# Patient Record
Sex: Female | Born: 1935 | Race: Black or African American | Hispanic: No | Smoking: Never smoker
Health system: Southern US, Community
[De-identification: ages and names within clinical notes are randomized; demographics above are authoritative.]

## PROBLEM LIST (undated history)

## (undated) DIAGNOSIS — K219 Gastro-esophageal reflux disease without esophagitis: Secondary | ICD-10-CM

## (undated) DIAGNOSIS — E079 Disorder of thyroid, unspecified: Secondary | ICD-10-CM

## (undated) DIAGNOSIS — E119 Type 2 diabetes mellitus without complications: Secondary | ICD-10-CM

## (undated) DIAGNOSIS — I10 Essential (primary) hypertension: Secondary | ICD-10-CM

## (undated) HISTORY — PX: ABDOMINAL HYSTERECTOMY: SHX81

---

## 2005-06-16 ENCOUNTER — Ambulatory Visit: Payer: Self-pay | Admitting: Urology

## 2005-07-11 ENCOUNTER — Ambulatory Visit: Payer: Self-pay | Admitting: Internal Medicine

## 2005-08-15 ENCOUNTER — Ambulatory Visit: Payer: Self-pay

## 2005-10-21 ENCOUNTER — Ambulatory Visit: Payer: Self-pay | Admitting: Internal Medicine

## 2006-07-31 ENCOUNTER — Ambulatory Visit: Payer: Self-pay | Admitting: Internal Medicine

## 2006-12-03 ENCOUNTER — Ambulatory Visit: Payer: Self-pay | Admitting: Internal Medicine

## 2007-05-24 ENCOUNTER — Inpatient Hospital Stay: Payer: Self-pay | Admitting: Internal Medicine

## 2007-05-24 ENCOUNTER — Other Ambulatory Visit: Payer: Self-pay

## 2007-12-06 ENCOUNTER — Ambulatory Visit: Payer: Self-pay | Admitting: Internal Medicine

## 2008-03-17 ENCOUNTER — Ambulatory Visit: Payer: Self-pay | Admitting: Gastroenterology

## 2008-06-14 ENCOUNTER — Ambulatory Visit: Payer: Self-pay | Admitting: Internal Medicine

## 2008-12-11 ENCOUNTER — Ambulatory Visit: Payer: Self-pay | Admitting: Internal Medicine

## 2009-12-17 ENCOUNTER — Ambulatory Visit: Payer: Self-pay

## 2010-10-01 ENCOUNTER — Encounter (INDEPENDENT_AMBULATORY_CARE_PROVIDER_SITE_OTHER): Payer: Medicare Other | Admitting: Ophthalmology

## 2010-10-01 DIAGNOSIS — H353290 Exudative age-related macular degeneration, unspecified eye, stage unspecified: Secondary | ICD-10-CM

## 2010-10-01 DIAGNOSIS — H43819 Vitreous degeneration, unspecified eye: Secondary | ICD-10-CM

## 2010-10-01 DIAGNOSIS — H3530 Unspecified macular degeneration: Secondary | ICD-10-CM

## 2010-10-28 ENCOUNTER — Ambulatory Visit: Payer: Self-pay | Admitting: Internal Medicine

## 2010-12-24 ENCOUNTER — Encounter (INDEPENDENT_AMBULATORY_CARE_PROVIDER_SITE_OTHER): Payer: Medicare Other | Admitting: Ophthalmology

## 2010-12-24 DIAGNOSIS — H43819 Vitreous degeneration, unspecified eye: Secondary | ICD-10-CM

## 2010-12-24 DIAGNOSIS — H2510 Age-related nuclear cataract, unspecified eye: Secondary | ICD-10-CM

## 2010-12-24 DIAGNOSIS — H3530 Unspecified macular degeneration: Secondary | ICD-10-CM

## 2010-12-24 DIAGNOSIS — H353290 Exudative age-related macular degeneration, unspecified eye, stage unspecified: Secondary | ICD-10-CM

## 2011-02-06 ENCOUNTER — Ambulatory Visit: Payer: Self-pay | Admitting: Internal Medicine

## 2011-02-18 ENCOUNTER — Ambulatory Visit: Payer: Self-pay | Admitting: Internal Medicine

## 2011-02-25 ENCOUNTER — Encounter (INDEPENDENT_AMBULATORY_CARE_PROVIDER_SITE_OTHER): Payer: Medicare Other | Admitting: Ophthalmology

## 2011-02-25 DIAGNOSIS — H43819 Vitreous degeneration, unspecified eye: Secondary | ICD-10-CM

## 2011-02-25 DIAGNOSIS — H2510 Age-related nuclear cataract, unspecified eye: Secondary | ICD-10-CM

## 2011-02-25 DIAGNOSIS — H353290 Exudative age-related macular degeneration, unspecified eye, stage unspecified: Secondary | ICD-10-CM

## 2011-02-25 DIAGNOSIS — H3530 Unspecified macular degeneration: Secondary | ICD-10-CM

## 2011-05-06 ENCOUNTER — Encounter (INDEPENDENT_AMBULATORY_CARE_PROVIDER_SITE_OTHER): Payer: Medicare Other | Admitting: Ophthalmology

## 2011-05-06 DIAGNOSIS — H43819 Vitreous degeneration, unspecified eye: Secondary | ICD-10-CM

## 2011-05-06 DIAGNOSIS — H3530 Unspecified macular degeneration: Secondary | ICD-10-CM

## 2011-05-06 DIAGNOSIS — H353290 Exudative age-related macular degeneration, unspecified eye, stage unspecified: Secondary | ICD-10-CM

## 2011-05-06 DIAGNOSIS — H2510 Age-related nuclear cataract, unspecified eye: Secondary | ICD-10-CM

## 2011-07-08 ENCOUNTER — Encounter (INDEPENDENT_AMBULATORY_CARE_PROVIDER_SITE_OTHER): Payer: Medicare Other | Admitting: Ophthalmology

## 2011-07-21 ENCOUNTER — Encounter (INDEPENDENT_AMBULATORY_CARE_PROVIDER_SITE_OTHER): Payer: Medicare Other | Admitting: Ophthalmology

## 2011-07-21 DIAGNOSIS — H353290 Exudative age-related macular degeneration, unspecified eye, stage unspecified: Secondary | ICD-10-CM

## 2011-07-21 DIAGNOSIS — H3530 Unspecified macular degeneration: Secondary | ICD-10-CM

## 2011-07-21 DIAGNOSIS — H43399 Other vitreous opacities, unspecified eye: Secondary | ICD-10-CM

## 2011-08-20 ENCOUNTER — Ambulatory Visit: Payer: Self-pay | Admitting: Internal Medicine

## 2011-09-29 ENCOUNTER — Encounter (INDEPENDENT_AMBULATORY_CARE_PROVIDER_SITE_OTHER): Payer: Medicare Other | Admitting: Ophthalmology

## 2011-09-29 DIAGNOSIS — H3530 Unspecified macular degeneration: Secondary | ICD-10-CM

## 2011-09-29 DIAGNOSIS — H43819 Vitreous degeneration, unspecified eye: Secondary | ICD-10-CM

## 2011-09-29 DIAGNOSIS — H353290 Exudative age-related macular degeneration, unspecified eye, stage unspecified: Secondary | ICD-10-CM

## 2011-09-29 DIAGNOSIS — I10 Essential (primary) hypertension: Secondary | ICD-10-CM

## 2011-09-29 DIAGNOSIS — H35039 Hypertensive retinopathy, unspecified eye: Secondary | ICD-10-CM

## 2011-10-23 ENCOUNTER — Ambulatory Visit: Payer: Self-pay

## 2011-10-23 LAB — COMPREHENSIVE METABOLIC PANEL
Albumin: 3.4 g/dL (ref 3.4–5.0)
BUN: 8 mg/dL (ref 7–18)
Bilirubin,Total: 0.2 mg/dL (ref 0.2–1.0)
Chloride: 102 mmol/L (ref 98–107)
Creatinine: 0.76 mg/dL (ref 0.60–1.30)
EGFR (African American): >60
Glucose: 78 mg/dL (ref 65–99)
SGPT (ALT): 34 U/L (ref 12–78)
Total Protein: 7.3 g/dL (ref 6.4–8.2)

## 2011-10-23 LAB — CBC WITH DIFFERENTIAL/PLATELET
Basophil %: 1.1 %
Eosinophil %: 2.5 %
HCT: 35.2 % (ref 35.0–47.0)
HGB: 11.7 g/dL — ABNORMAL LOW (ref 12.0–16.0)
Lymphocyte %: 26.5 %
MCHC: 33.3 g/dL (ref 32.0–36.0)
MCV: 94 fL (ref 80–100)
Monocyte #: 0.6 x10 3/mm (ref 0.2–0.9)
Monocyte %: 7.3 %
Neutrophil %: 62.6 %
WBC: 8.5 10*3/uL (ref 3.6–11.0)

## 2011-11-03 ENCOUNTER — Encounter (INDEPENDENT_AMBULATORY_CARE_PROVIDER_SITE_OTHER): Payer: Medicare Other | Admitting: Ophthalmology

## 2011-11-03 DIAGNOSIS — I10 Essential (primary) hypertension: Secondary | ICD-10-CM

## 2011-11-03 DIAGNOSIS — H353290 Exudative age-related macular degeneration, unspecified eye, stage unspecified: Secondary | ICD-10-CM

## 2011-11-03 DIAGNOSIS — H35039 Hypertensive retinopathy, unspecified eye: Secondary | ICD-10-CM

## 2011-11-03 DIAGNOSIS — H43819 Vitreous degeneration, unspecified eye: Secondary | ICD-10-CM

## 2011-12-01 ENCOUNTER — Encounter (INDEPENDENT_AMBULATORY_CARE_PROVIDER_SITE_OTHER): Payer: Medicare Other | Admitting: Ophthalmology

## 2011-12-01 DIAGNOSIS — H353290 Exudative age-related macular degeneration, unspecified eye, stage unspecified: Secondary | ICD-10-CM

## 2011-12-01 DIAGNOSIS — H43819 Vitreous degeneration, unspecified eye: Secondary | ICD-10-CM

## 2011-12-01 DIAGNOSIS — I10 Essential (primary) hypertension: Secondary | ICD-10-CM

## 2011-12-01 DIAGNOSIS — H35039 Hypertensive retinopathy, unspecified eye: Secondary | ICD-10-CM

## 2012-02-02 ENCOUNTER — Encounter (INDEPENDENT_AMBULATORY_CARE_PROVIDER_SITE_OTHER): Payer: Medicare PPO | Admitting: Ophthalmology

## 2012-02-02 DIAGNOSIS — H353290 Exudative age-related macular degeneration, unspecified eye, stage unspecified: Secondary | ICD-10-CM

## 2012-02-02 DIAGNOSIS — I10 Essential (primary) hypertension: Secondary | ICD-10-CM

## 2012-02-02 DIAGNOSIS — H35039 Hypertensive retinopathy, unspecified eye: Secondary | ICD-10-CM

## 2012-02-02 DIAGNOSIS — H43819 Vitreous degeneration, unspecified eye: Secondary | ICD-10-CM

## 2012-02-24 ENCOUNTER — Encounter (INDEPENDENT_AMBULATORY_CARE_PROVIDER_SITE_OTHER): Payer: Medicare PPO | Admitting: Ophthalmology

## 2012-02-24 DIAGNOSIS — H35039 Hypertensive retinopathy, unspecified eye: Secondary | ICD-10-CM

## 2012-02-24 DIAGNOSIS — I10 Essential (primary) hypertension: Secondary | ICD-10-CM

## 2012-02-24 DIAGNOSIS — H43819 Vitreous degeneration, unspecified eye: Secondary | ICD-10-CM

## 2012-02-24 DIAGNOSIS — H353290 Exudative age-related macular degeneration, unspecified eye, stage unspecified: Secondary | ICD-10-CM

## 2012-04-19 ENCOUNTER — Encounter (INDEPENDENT_AMBULATORY_CARE_PROVIDER_SITE_OTHER): Payer: Medicare HMO | Admitting: Ophthalmology

## 2012-04-19 DIAGNOSIS — H43819 Vitreous degeneration, unspecified eye: Secondary | ICD-10-CM

## 2012-04-19 DIAGNOSIS — H353290 Exudative age-related macular degeneration, unspecified eye, stage unspecified: Secondary | ICD-10-CM

## 2012-04-19 DIAGNOSIS — I10 Essential (primary) hypertension: Secondary | ICD-10-CM

## 2012-04-19 DIAGNOSIS — H35039 Hypertensive retinopathy, unspecified eye: Secondary | ICD-10-CM

## 2012-04-29 ENCOUNTER — Ambulatory Visit: Payer: Self-pay | Admitting: Family Medicine

## 2012-05-11 ENCOUNTER — Encounter (INDEPENDENT_AMBULATORY_CARE_PROVIDER_SITE_OTHER): Payer: Medicare HMO | Admitting: Ophthalmology

## 2012-05-11 DIAGNOSIS — H353290 Exudative age-related macular degeneration, unspecified eye, stage unspecified: Secondary | ICD-10-CM

## 2012-05-11 DIAGNOSIS — I10 Essential (primary) hypertension: Secondary | ICD-10-CM

## 2012-05-11 DIAGNOSIS — H35039 Hypertensive retinopathy, unspecified eye: Secondary | ICD-10-CM

## 2012-05-11 DIAGNOSIS — H43819 Vitreous degeneration, unspecified eye: Secondary | ICD-10-CM

## 2012-07-19 ENCOUNTER — Encounter (INDEPENDENT_AMBULATORY_CARE_PROVIDER_SITE_OTHER): Payer: Medicare HMO | Admitting: Ophthalmology

## 2012-07-19 DIAGNOSIS — E785 Hyperlipidemia, unspecified: Secondary | ICD-10-CM | POA: Insufficient documentation

## 2012-07-19 DIAGNOSIS — I10 Essential (primary) hypertension: Secondary | ICD-10-CM | POA: Insufficient documentation

## 2012-07-19 DIAGNOSIS — H35039 Hypertensive retinopathy, unspecified eye: Secondary | ICD-10-CM

## 2012-07-19 DIAGNOSIS — H353290 Exudative age-related macular degeneration, unspecified eye, stage unspecified: Secondary | ICD-10-CM

## 2012-07-19 DIAGNOSIS — M1990 Unspecified osteoarthritis, unspecified site: Secondary | ICD-10-CM | POA: Insufficient documentation

## 2012-07-19 DIAGNOSIS — J302 Other seasonal allergic rhinitis: Secondary | ICD-10-CM | POA: Insufficient documentation

## 2012-07-19 DIAGNOSIS — H43819 Vitreous degeneration, unspecified eye: Secondary | ICD-10-CM

## 2012-08-10 ENCOUNTER — Encounter (INDEPENDENT_AMBULATORY_CARE_PROVIDER_SITE_OTHER): Payer: Medicare HMO | Admitting: Ophthalmology

## 2012-08-30 ENCOUNTER — Encounter (INDEPENDENT_AMBULATORY_CARE_PROVIDER_SITE_OTHER): Payer: Medicare HMO | Admitting: Ophthalmology

## 2012-08-30 DIAGNOSIS — I10 Essential (primary) hypertension: Secondary | ICD-10-CM

## 2012-08-30 DIAGNOSIS — H353290 Exudative age-related macular degeneration, unspecified eye, stage unspecified: Secondary | ICD-10-CM

## 2012-08-30 DIAGNOSIS — M8580 Other specified disorders of bone density and structure, unspecified site: Secondary | ICD-10-CM | POA: Insufficient documentation

## 2012-08-30 DIAGNOSIS — H35039 Hypertensive retinopathy, unspecified eye: Secondary | ICD-10-CM

## 2012-08-30 DIAGNOSIS — H43819 Vitreous degeneration, unspecified eye: Secondary | ICD-10-CM

## 2012-08-30 DIAGNOSIS — E1139 Type 2 diabetes mellitus with other diabetic ophthalmic complication: Secondary | ICD-10-CM

## 2012-08-30 DIAGNOSIS — E11319 Type 2 diabetes mellitus with unspecified diabetic retinopathy without macular edema: Secondary | ICD-10-CM

## 2012-10-11 ENCOUNTER — Encounter (INDEPENDENT_AMBULATORY_CARE_PROVIDER_SITE_OTHER): Payer: Medicare HMO | Admitting: Ophthalmology

## 2012-10-11 DIAGNOSIS — H35039 Hypertensive retinopathy, unspecified eye: Secondary | ICD-10-CM

## 2012-10-11 DIAGNOSIS — I10 Essential (primary) hypertension: Secondary | ICD-10-CM

## 2012-10-11 DIAGNOSIS — H353290 Exudative age-related macular degeneration, unspecified eye, stage unspecified: Secondary | ICD-10-CM

## 2012-10-11 DIAGNOSIS — H43819 Vitreous degeneration, unspecified eye: Secondary | ICD-10-CM

## 2012-11-01 ENCOUNTER — Encounter (INDEPENDENT_AMBULATORY_CARE_PROVIDER_SITE_OTHER): Payer: Medicare HMO | Admitting: Ophthalmology

## 2012-11-02 ENCOUNTER — Encounter (INDEPENDENT_AMBULATORY_CARE_PROVIDER_SITE_OTHER): Payer: Medicare HMO | Admitting: Ophthalmology

## 2012-11-02 DIAGNOSIS — H35039 Hypertensive retinopathy, unspecified eye: Secondary | ICD-10-CM

## 2012-11-02 DIAGNOSIS — I10 Essential (primary) hypertension: Secondary | ICD-10-CM

## 2012-11-02 DIAGNOSIS — H43819 Vitreous degeneration, unspecified eye: Secondary | ICD-10-CM

## 2012-11-02 DIAGNOSIS — H3530 Unspecified macular degeneration: Secondary | ICD-10-CM

## 2012-11-02 DIAGNOSIS — E1139 Type 2 diabetes mellitus with other diabetic ophthalmic complication: Secondary | ICD-10-CM

## 2012-11-02 DIAGNOSIS — E11319 Type 2 diabetes mellitus with unspecified diabetic retinopathy without macular edema: Secondary | ICD-10-CM

## 2012-11-02 DIAGNOSIS — H353290 Exudative age-related macular degeneration, unspecified eye, stage unspecified: Secondary | ICD-10-CM

## 2012-11-11 DIAGNOSIS — M26629 Arthralgia of temporomandibular joint, unspecified side: Secondary | ICD-10-CM | POA: Insufficient documentation

## 2012-11-30 ENCOUNTER — Encounter (INDEPENDENT_AMBULATORY_CARE_PROVIDER_SITE_OTHER): Payer: Medicare HMO | Admitting: Ophthalmology

## 2012-11-30 DIAGNOSIS — I10 Essential (primary) hypertension: Secondary | ICD-10-CM

## 2012-11-30 DIAGNOSIS — H43819 Vitreous degeneration, unspecified eye: Secondary | ICD-10-CM

## 2012-11-30 DIAGNOSIS — H35039 Hypertensive retinopathy, unspecified eye: Secondary | ICD-10-CM

## 2012-11-30 DIAGNOSIS — E1139 Type 2 diabetes mellitus with other diabetic ophthalmic complication: Secondary | ICD-10-CM

## 2012-11-30 DIAGNOSIS — E11319 Type 2 diabetes mellitus with unspecified diabetic retinopathy without macular edema: Secondary | ICD-10-CM

## 2012-11-30 DIAGNOSIS — H3530 Unspecified macular degeneration: Secondary | ICD-10-CM

## 2012-11-30 DIAGNOSIS — H353290 Exudative age-related macular degeneration, unspecified eye, stage unspecified: Secondary | ICD-10-CM

## 2013-01-17 ENCOUNTER — Encounter (INDEPENDENT_AMBULATORY_CARE_PROVIDER_SITE_OTHER): Payer: Medicare HMO | Admitting: Ophthalmology

## 2013-01-17 DIAGNOSIS — H353290 Exudative age-related macular degeneration, unspecified eye, stage unspecified: Secondary | ICD-10-CM

## 2013-01-17 DIAGNOSIS — E1139 Type 2 diabetes mellitus with other diabetic ophthalmic complication: Secondary | ICD-10-CM

## 2013-01-17 DIAGNOSIS — E11319 Type 2 diabetes mellitus with unspecified diabetic retinopathy without macular edema: Secondary | ICD-10-CM

## 2013-01-17 DIAGNOSIS — I10 Essential (primary) hypertension: Secondary | ICD-10-CM

## 2013-01-17 DIAGNOSIS — H35039 Hypertensive retinopathy, unspecified eye: Secondary | ICD-10-CM

## 2013-01-17 DIAGNOSIS — H43819 Vitreous degeneration, unspecified eye: Secondary | ICD-10-CM

## 2013-03-01 ENCOUNTER — Encounter (INDEPENDENT_AMBULATORY_CARE_PROVIDER_SITE_OTHER): Payer: Medicare HMO | Admitting: Ophthalmology

## 2013-03-01 DIAGNOSIS — E1165 Type 2 diabetes mellitus with hyperglycemia: Secondary | ICD-10-CM

## 2013-03-01 DIAGNOSIS — I10 Essential (primary) hypertension: Secondary | ICD-10-CM

## 2013-03-01 DIAGNOSIS — E1139 Type 2 diabetes mellitus with other diabetic ophthalmic complication: Secondary | ICD-10-CM

## 2013-03-01 DIAGNOSIS — H35039 Hypertensive retinopathy, unspecified eye: Secondary | ICD-10-CM

## 2013-03-01 DIAGNOSIS — H3530 Unspecified macular degeneration: Secondary | ICD-10-CM

## 2013-03-01 DIAGNOSIS — E11319 Type 2 diabetes mellitus with unspecified diabetic retinopathy without macular edema: Secondary | ICD-10-CM

## 2013-03-01 DIAGNOSIS — H353290 Exudative age-related macular degeneration, unspecified eye, stage unspecified: Secondary | ICD-10-CM

## 2013-04-12 ENCOUNTER — Encounter (INDEPENDENT_AMBULATORY_CARE_PROVIDER_SITE_OTHER): Payer: Medicare HMO | Admitting: Ophthalmology

## 2013-04-12 DIAGNOSIS — I10 Essential (primary) hypertension: Secondary | ICD-10-CM

## 2013-04-12 DIAGNOSIS — H3530 Unspecified macular degeneration: Secondary | ICD-10-CM

## 2013-04-12 DIAGNOSIS — H353290 Exudative age-related macular degeneration, unspecified eye, stage unspecified: Secondary | ICD-10-CM

## 2013-04-12 DIAGNOSIS — H43819 Vitreous degeneration, unspecified eye: Secondary | ICD-10-CM

## 2013-04-12 DIAGNOSIS — H35039 Hypertensive retinopathy, unspecified eye: Secondary | ICD-10-CM

## 2013-05-24 ENCOUNTER — Encounter (INDEPENDENT_AMBULATORY_CARE_PROVIDER_SITE_OTHER): Payer: Medicare HMO | Admitting: Ophthalmology

## 2013-05-24 DIAGNOSIS — I10 Essential (primary) hypertension: Secondary | ICD-10-CM

## 2013-05-24 DIAGNOSIS — H35039 Hypertensive retinopathy, unspecified eye: Secondary | ICD-10-CM

## 2013-05-24 DIAGNOSIS — H43819 Vitreous degeneration, unspecified eye: Secondary | ICD-10-CM

## 2013-05-24 DIAGNOSIS — H3530 Unspecified macular degeneration: Secondary | ICD-10-CM

## 2013-05-24 DIAGNOSIS — E11319 Type 2 diabetes mellitus with unspecified diabetic retinopathy without macular edema: Secondary | ICD-10-CM

## 2013-05-24 DIAGNOSIS — E1165 Type 2 diabetes mellitus with hyperglycemia: Secondary | ICD-10-CM

## 2013-05-24 DIAGNOSIS — E1139 Type 2 diabetes mellitus with other diabetic ophthalmic complication: Secondary | ICD-10-CM

## 2013-05-24 DIAGNOSIS — H353290 Exudative age-related macular degeneration, unspecified eye, stage unspecified: Secondary | ICD-10-CM

## 2013-07-05 ENCOUNTER — Encounter (INDEPENDENT_AMBULATORY_CARE_PROVIDER_SITE_OTHER): Payer: Medicare HMO | Admitting: Ophthalmology

## 2013-07-05 DIAGNOSIS — E1139 Type 2 diabetes mellitus with other diabetic ophthalmic complication: Secondary | ICD-10-CM

## 2013-07-05 DIAGNOSIS — H353290 Exudative age-related macular degeneration, unspecified eye, stage unspecified: Secondary | ICD-10-CM

## 2013-07-05 DIAGNOSIS — I10 Essential (primary) hypertension: Secondary | ICD-10-CM

## 2013-07-05 DIAGNOSIS — H43819 Vitreous degeneration, unspecified eye: Secondary | ICD-10-CM

## 2013-07-05 DIAGNOSIS — E11319 Type 2 diabetes mellitus with unspecified diabetic retinopathy without macular edema: Secondary | ICD-10-CM

## 2013-07-05 DIAGNOSIS — H35039 Hypertensive retinopathy, unspecified eye: Secondary | ICD-10-CM

## 2013-07-05 DIAGNOSIS — E1165 Type 2 diabetes mellitus with hyperglycemia: Secondary | ICD-10-CM

## 2013-08-01 ENCOUNTER — Emergency Department: Payer: Self-pay | Admitting: Emergency Medicine

## 2013-08-09 ENCOUNTER — Encounter (INDEPENDENT_AMBULATORY_CARE_PROVIDER_SITE_OTHER): Payer: Medicare HMO | Admitting: Ophthalmology

## 2013-08-09 DIAGNOSIS — H35039 Hypertensive retinopathy, unspecified eye: Secondary | ICD-10-CM

## 2013-08-09 DIAGNOSIS — H353290 Exudative age-related macular degeneration, unspecified eye, stage unspecified: Secondary | ICD-10-CM

## 2013-08-09 DIAGNOSIS — I10 Essential (primary) hypertension: Secondary | ICD-10-CM

## 2013-08-09 DIAGNOSIS — E11319 Type 2 diabetes mellitus with unspecified diabetic retinopathy without macular edema: Secondary | ICD-10-CM

## 2013-08-09 DIAGNOSIS — E1165 Type 2 diabetes mellitus with hyperglycemia: Secondary | ICD-10-CM

## 2013-08-09 DIAGNOSIS — E1139 Type 2 diabetes mellitus with other diabetic ophthalmic complication: Secondary | ICD-10-CM

## 2013-08-09 DIAGNOSIS — H3530 Unspecified macular degeneration: Secondary | ICD-10-CM

## 2013-08-09 DIAGNOSIS — H43819 Vitreous degeneration, unspecified eye: Secondary | ICD-10-CM

## 2013-09-13 ENCOUNTER — Encounter (INDEPENDENT_AMBULATORY_CARE_PROVIDER_SITE_OTHER): Payer: Medicare HMO | Admitting: Ophthalmology

## 2013-09-13 DIAGNOSIS — H353290 Exudative age-related macular degeneration, unspecified eye, stage unspecified: Secondary | ICD-10-CM

## 2013-09-13 DIAGNOSIS — H43819 Vitreous degeneration, unspecified eye: Secondary | ICD-10-CM

## 2013-09-13 DIAGNOSIS — E1139 Type 2 diabetes mellitus with other diabetic ophthalmic complication: Secondary | ICD-10-CM

## 2013-09-13 DIAGNOSIS — E11319 Type 2 diabetes mellitus with unspecified diabetic retinopathy without macular edema: Secondary | ICD-10-CM

## 2013-09-13 DIAGNOSIS — E1165 Type 2 diabetes mellitus with hyperglycemia: Secondary | ICD-10-CM

## 2013-09-13 DIAGNOSIS — H35039 Hypertensive retinopathy, unspecified eye: Secondary | ICD-10-CM

## 2013-09-13 DIAGNOSIS — I10 Essential (primary) hypertension: Secondary | ICD-10-CM

## 2013-10-18 ENCOUNTER — Encounter (INDEPENDENT_AMBULATORY_CARE_PROVIDER_SITE_OTHER): Payer: Medicare HMO | Admitting: Ophthalmology

## 2013-10-18 DIAGNOSIS — I10 Essential (primary) hypertension: Secondary | ICD-10-CM

## 2013-10-18 DIAGNOSIS — E1139 Type 2 diabetes mellitus with other diabetic ophthalmic complication: Secondary | ICD-10-CM

## 2013-10-18 DIAGNOSIS — E11319 Type 2 diabetes mellitus with unspecified diabetic retinopathy without macular edema: Secondary | ICD-10-CM

## 2013-10-18 DIAGNOSIS — H353290 Exudative age-related macular degeneration, unspecified eye, stage unspecified: Secondary | ICD-10-CM

## 2013-10-18 DIAGNOSIS — H3530 Unspecified macular degeneration: Secondary | ICD-10-CM

## 2013-10-18 DIAGNOSIS — H43819 Vitreous degeneration, unspecified eye: Secondary | ICD-10-CM

## 2013-10-18 DIAGNOSIS — H35039 Hypertensive retinopathy, unspecified eye: Secondary | ICD-10-CM

## 2013-10-18 DIAGNOSIS — E1165 Type 2 diabetes mellitus with hyperglycemia: Secondary | ICD-10-CM

## 2013-11-22 ENCOUNTER — Encounter (INDEPENDENT_AMBULATORY_CARE_PROVIDER_SITE_OTHER): Payer: Medicare HMO | Admitting: Ophthalmology

## 2013-11-22 DIAGNOSIS — H43813 Vitreous degeneration, bilateral: Secondary | ICD-10-CM

## 2013-11-22 DIAGNOSIS — H35033 Hypertensive retinopathy, bilateral: Secondary | ICD-10-CM

## 2013-11-22 DIAGNOSIS — I10 Essential (primary) hypertension: Secondary | ICD-10-CM

## 2013-11-22 DIAGNOSIS — H3532 Exudative age-related macular degeneration: Secondary | ICD-10-CM

## 2013-12-27 ENCOUNTER — Encounter (INDEPENDENT_AMBULATORY_CARE_PROVIDER_SITE_OTHER): Payer: Medicare HMO | Admitting: Ophthalmology

## 2013-12-27 DIAGNOSIS — E11311 Type 2 diabetes mellitus with unspecified diabetic retinopathy with macular edema: Secondary | ICD-10-CM

## 2013-12-27 DIAGNOSIS — H43813 Vitreous degeneration, bilateral: Secondary | ICD-10-CM

## 2013-12-27 DIAGNOSIS — E11339 Type 2 diabetes mellitus with moderate nonproliferative diabetic retinopathy without macular edema: Secondary | ICD-10-CM

## 2013-12-27 DIAGNOSIS — E11321 Type 2 diabetes mellitus with mild nonproliferative diabetic retinopathy with macular edema: Secondary | ICD-10-CM

## 2013-12-27 DIAGNOSIS — H3532 Exudative age-related macular degeneration: Secondary | ICD-10-CM

## 2013-12-27 DIAGNOSIS — H35033 Hypertensive retinopathy, bilateral: Secondary | ICD-10-CM

## 2013-12-27 DIAGNOSIS — I10 Essential (primary) hypertension: Secondary | ICD-10-CM

## 2014-01-31 ENCOUNTER — Encounter (INDEPENDENT_AMBULATORY_CARE_PROVIDER_SITE_OTHER): Payer: Medicare HMO | Admitting: Ophthalmology

## 2014-02-20 ENCOUNTER — Encounter (INDEPENDENT_AMBULATORY_CARE_PROVIDER_SITE_OTHER): Payer: Medicare HMO | Admitting: Ophthalmology

## 2014-02-20 DIAGNOSIS — E11319 Type 2 diabetes mellitus with unspecified diabetic retinopathy without macular edema: Secondary | ICD-10-CM

## 2014-02-20 DIAGNOSIS — H3532 Exudative age-related macular degeneration: Secondary | ICD-10-CM

## 2014-02-20 DIAGNOSIS — E11329 Type 2 diabetes mellitus with mild nonproliferative diabetic retinopathy without macular edema: Secondary | ICD-10-CM

## 2014-02-20 DIAGNOSIS — H35033 Hypertensive retinopathy, bilateral: Secondary | ICD-10-CM

## 2014-02-20 DIAGNOSIS — H3531 Nonexudative age-related macular degeneration: Secondary | ICD-10-CM

## 2014-02-20 DIAGNOSIS — H43813 Vitreous degeneration, bilateral: Secondary | ICD-10-CM

## 2014-03-17 ENCOUNTER — Encounter (INDEPENDENT_AMBULATORY_CARE_PROVIDER_SITE_OTHER): Payer: Medicare HMO | Admitting: Ophthalmology

## 2014-03-24 ENCOUNTER — Encounter (INDEPENDENT_AMBULATORY_CARE_PROVIDER_SITE_OTHER): Payer: Medicare HMO | Admitting: Ophthalmology

## 2014-03-24 DIAGNOSIS — E11319 Type 2 diabetes mellitus with unspecified diabetic retinopathy without macular edema: Secondary | ICD-10-CM

## 2014-03-24 DIAGNOSIS — H35033 Hypertensive retinopathy, bilateral: Secondary | ICD-10-CM

## 2014-03-24 DIAGNOSIS — I10 Essential (primary) hypertension: Secondary | ICD-10-CM

## 2014-03-24 DIAGNOSIS — H3531 Nonexudative age-related macular degeneration: Secondary | ICD-10-CM | POA: Diagnosis not present

## 2014-03-24 DIAGNOSIS — H43813 Vitreous degeneration, bilateral: Secondary | ICD-10-CM | POA: Diagnosis not present

## 2014-03-24 DIAGNOSIS — H3532 Exudative age-related macular degeneration: Secondary | ICD-10-CM

## 2014-04-28 ENCOUNTER — Encounter (INDEPENDENT_AMBULATORY_CARE_PROVIDER_SITE_OTHER): Payer: Medicare HMO | Admitting: Ophthalmology

## 2014-04-28 DIAGNOSIS — H3532 Exudative age-related macular degeneration: Secondary | ICD-10-CM | POA: Diagnosis not present

## 2014-04-28 DIAGNOSIS — H43813 Vitreous degeneration, bilateral: Secondary | ICD-10-CM

## 2014-04-28 DIAGNOSIS — E11319 Type 2 diabetes mellitus with unspecified diabetic retinopathy without macular edema: Secondary | ICD-10-CM | POA: Diagnosis not present

## 2014-04-28 DIAGNOSIS — E11329 Type 2 diabetes mellitus with mild nonproliferative diabetic retinopathy without macular edema: Secondary | ICD-10-CM

## 2014-04-28 DIAGNOSIS — I10 Essential (primary) hypertension: Secondary | ICD-10-CM | POA: Diagnosis not present

## 2014-04-28 DIAGNOSIS — H35033 Hypertensive retinopathy, bilateral: Secondary | ICD-10-CM

## 2014-04-28 DIAGNOSIS — H3531 Nonexudative age-related macular degeneration: Secondary | ICD-10-CM

## 2014-06-02 ENCOUNTER — Encounter (INDEPENDENT_AMBULATORY_CARE_PROVIDER_SITE_OTHER): Payer: Medicare HMO | Admitting: Ophthalmology

## 2014-06-02 DIAGNOSIS — E11319 Type 2 diabetes mellitus with unspecified diabetic retinopathy without macular edema: Secondary | ICD-10-CM

## 2014-06-02 DIAGNOSIS — H3531 Nonexudative age-related macular degeneration: Secondary | ICD-10-CM | POA: Diagnosis not present

## 2014-06-02 DIAGNOSIS — I10 Essential (primary) hypertension: Secondary | ICD-10-CM | POA: Diagnosis not present

## 2014-06-02 DIAGNOSIS — H43813 Vitreous degeneration, bilateral: Secondary | ICD-10-CM

## 2014-06-02 DIAGNOSIS — H3532 Exudative age-related macular degeneration: Secondary | ICD-10-CM

## 2014-06-02 DIAGNOSIS — E11329 Type 2 diabetes mellitus with mild nonproliferative diabetic retinopathy without macular edema: Secondary | ICD-10-CM

## 2014-06-02 DIAGNOSIS — H35033 Hypertensive retinopathy, bilateral: Secondary | ICD-10-CM

## 2014-07-07 ENCOUNTER — Encounter (INDEPENDENT_AMBULATORY_CARE_PROVIDER_SITE_OTHER): Payer: Medicare PPO | Admitting: Ophthalmology

## 2014-07-07 DIAGNOSIS — H35033 Hypertensive retinopathy, bilateral: Secondary | ICD-10-CM

## 2014-07-07 DIAGNOSIS — I10 Essential (primary) hypertension: Secondary | ICD-10-CM | POA: Diagnosis not present

## 2014-07-07 DIAGNOSIS — E11319 Type 2 diabetes mellitus with unspecified diabetic retinopathy without macular edema: Secondary | ICD-10-CM | POA: Diagnosis not present

## 2014-07-07 DIAGNOSIS — H3532 Exudative age-related macular degeneration: Secondary | ICD-10-CM

## 2014-07-07 DIAGNOSIS — E11339 Type 2 diabetes mellitus with moderate nonproliferative diabetic retinopathy without macular edema: Secondary | ICD-10-CM

## 2014-07-07 DIAGNOSIS — H43813 Vitreous degeneration, bilateral: Secondary | ICD-10-CM | POA: Diagnosis not present

## 2014-07-07 DIAGNOSIS — E11329 Type 2 diabetes mellitus with mild nonproliferative diabetic retinopathy without macular edema: Secondary | ICD-10-CM

## 2014-07-28 DIAGNOSIS — R809 Proteinuria, unspecified: Secondary | ICD-10-CM | POA: Insufficient documentation

## 2014-08-03 DIAGNOSIS — D693 Immune thrombocytopenic purpura: Secondary | ICD-10-CM | POA: Insufficient documentation

## 2014-08-04 ENCOUNTER — Encounter (INDEPENDENT_AMBULATORY_CARE_PROVIDER_SITE_OTHER): Payer: Medicare PPO | Admitting: Ophthalmology

## 2014-08-04 DIAGNOSIS — H43813 Vitreous degeneration, bilateral: Secondary | ICD-10-CM | POA: Diagnosis not present

## 2014-08-04 DIAGNOSIS — I10 Essential (primary) hypertension: Secondary | ICD-10-CM | POA: Diagnosis not present

## 2014-08-04 DIAGNOSIS — H35033 Hypertensive retinopathy, bilateral: Secondary | ICD-10-CM | POA: Diagnosis not present

## 2014-08-04 DIAGNOSIS — H3532 Exudative age-related macular degeneration: Secondary | ICD-10-CM | POA: Diagnosis not present

## 2014-09-01 ENCOUNTER — Encounter (INDEPENDENT_AMBULATORY_CARE_PROVIDER_SITE_OTHER): Payer: Medicare HMO | Admitting: Ophthalmology

## 2014-09-08 ENCOUNTER — Encounter (INDEPENDENT_AMBULATORY_CARE_PROVIDER_SITE_OTHER): Payer: Medicare HMO | Admitting: Ophthalmology

## 2014-09-08 DIAGNOSIS — I10 Essential (primary) hypertension: Secondary | ICD-10-CM | POA: Diagnosis not present

## 2014-09-08 DIAGNOSIS — H35033 Hypertensive retinopathy, bilateral: Secondary | ICD-10-CM

## 2014-09-08 DIAGNOSIS — H43813 Vitreous degeneration, bilateral: Secondary | ICD-10-CM

## 2014-09-08 DIAGNOSIS — H3532 Exudative age-related macular degeneration: Secondary | ICD-10-CM

## 2014-10-06 ENCOUNTER — Encounter (INDEPENDENT_AMBULATORY_CARE_PROVIDER_SITE_OTHER): Payer: Medicare HMO | Admitting: Ophthalmology

## 2014-10-24 DIAGNOSIS — K5901 Slow transit constipation: Secondary | ICD-10-CM | POA: Insufficient documentation

## 2015-01-23 DIAGNOSIS — R079 Chest pain, unspecified: Secondary | ICD-10-CM | POA: Insufficient documentation

## 2015-03-17 ENCOUNTER — Encounter: Payer: Self-pay | Admitting: Emergency Medicine

## 2015-03-17 ENCOUNTER — Emergency Department: Payer: Medicare HMO

## 2015-03-17 ENCOUNTER — Emergency Department
Admission: EM | Admit: 2015-03-17 | Discharge: 2015-03-17 | Disposition: A | Discharge status: Home / Self Care | Payer: Medicare HMO | Attending: Emergency Medicine | Admitting: Emergency Medicine

## 2015-03-17 DIAGNOSIS — Y9289 Other specified places as the place of occurrence of the external cause: Secondary | ICD-10-CM | POA: Insufficient documentation

## 2015-03-17 DIAGNOSIS — E119 Type 2 diabetes mellitus without complications: Secondary | ICD-10-CM | POA: Diagnosis not present

## 2015-03-17 DIAGNOSIS — S61411A Laceration without foreign body of right hand, initial encounter: Secondary | ICD-10-CM

## 2015-03-17 DIAGNOSIS — S6991XA Unspecified injury of right wrist, hand and finger(s), initial encounter: Secondary | ICD-10-CM | POA: Diagnosis present

## 2015-03-17 DIAGNOSIS — Y9389 Activity, other specified: Secondary | ICD-10-CM | POA: Diagnosis not present

## 2015-03-17 DIAGNOSIS — I10 Essential (primary) hypertension: Secondary | ICD-10-CM | POA: Insufficient documentation

## 2015-03-17 DIAGNOSIS — M545 Low back pain, unspecified: Secondary | ICD-10-CM

## 2015-03-17 DIAGNOSIS — S3992XA Unspecified injury of lower back, initial encounter: Secondary | ICD-10-CM | POA: Diagnosis not present

## 2015-03-17 DIAGNOSIS — W010XXA Fall on same level from slipping, tripping and stumbling without subsequent striking against object, initial encounter: Secondary | ICD-10-CM | POA: Diagnosis not present

## 2015-03-17 DIAGNOSIS — Y998 Other external cause status: Secondary | ICD-10-CM | POA: Diagnosis not present

## 2015-03-17 DIAGNOSIS — Z23 Encounter for immunization: Secondary | ICD-10-CM | POA: Insufficient documentation

## 2015-03-17 HISTORY — DX: Essential (primary) hypertension: I10

## 2015-03-17 HISTORY — DX: Type 2 diabetes mellitus without complications: E11.9

## 2015-03-17 LAB — COMPREHENSIVE METABOLIC PANEL
ALT: 15 U/L (ref 14–54)
AST: 19 U/L (ref 15–41)
Albumin: 4.1 g/dL (ref 3.5–5.0)
Alkaline Phosphatase: 77 U/L (ref 38–126)
Anion gap: 8 (ref 5–15)
BUN: 22 mg/dL — AB (ref 6–20)
CO2: 30 mmol/L (ref 22–32)
CREATININE: 1.27 mg/dL — AB (ref 0.44–1.00)
Calcium: 9.3 mg/dL (ref 8.9–10.3)
Chloride: 98 mmol/L — ABNORMAL LOW (ref 101–111)
GFR calc non Af Amer: 38 mL/min — ABNORMAL LOW (ref >60)
GFR, EST AFRICAN AMERICAN: 44 mL/min — AB (ref >60)
GLUCOSE: 118 mg/dL — AB (ref 65–99)
Potassium: 4.3 mmol/L (ref 3.5–5.1)
SODIUM: 136 mmol/L (ref 135–145)
TOTAL PROTEIN: 7.4 g/dL (ref 6.5–8.1)
Total Bilirubin: 0.5 mg/dL (ref 0.3–1.2)

## 2015-03-17 LAB — CBC WITH DIFFERENTIAL/PLATELET
Basophils Absolute: 0.1 10*3/uL (ref 0–0.1)
Basophils Relative: 1 %
EOS ABS: 0.1 10*3/uL (ref 0–0.7)
EOS PCT: 1 %
HCT: 33.2 % — ABNORMAL LOW (ref 35.0–47.0)
Hemoglobin: 11.5 g/dL — ABNORMAL LOW (ref 12.0–16.0)
LYMPHS ABS: 2 10*3/uL (ref 1.0–3.6)
Lymphocytes Relative: 23 %
MCH: 31.9 pg (ref 26.0–34.0)
MCHC: 34.7 g/dL (ref 32.0–36.0)
MCV: 92.1 fL (ref 80.0–100.0)
MONOS PCT: 7 %
Monocytes Absolute: 0.6 10*3/uL (ref 0.2–0.9)
Neutro Abs: 5.8 10*3/uL (ref 1.4–6.5)
Neutrophils Relative %: 68 %
PLATELETS: 202 10*3/uL (ref 150–440)
RBC: 3.61 MIL/uL — AB (ref 3.80–5.20)
RDW: 13.1 % (ref 11.5–14.5)
WBC: 8.6 10*3/uL (ref 3.6–11.0)

## 2015-03-17 MED ORDER — TETANUS-DIPHTH-ACELL PERTUSSIS 5-2.5-18.5 LF-MCG/0.5 IM SUSP
0.5000 mL | Freq: Once | INTRAMUSCULAR | Status: AC
  Administered 2015-03-17: 0.5 mL via INTRAMUSCULAR
  Filled 2015-03-17: qty 0.5

## 2015-03-17 MED ORDER — BACITRACIN ZINC 500 UNIT/GM EX OINT
1.0000 "application " | TOPICAL_OINTMENT | Freq: Two times a day (BID) | CUTANEOUS | Status: DC
  Administered 2015-03-17: 1 via TOPICAL

## 2015-03-17 MED ORDER — LIDOCAINE HCL (PF) 1 % IJ SOLN
5.0000 mL | Freq: Once | INTRAMUSCULAR | Status: DC
  Filled 2015-03-17: qty 5

## 2015-03-17 MED ORDER — BACITRACIN ZINC 500 UNIT/GM EX OINT
TOPICAL_OINTMENT | CUTANEOUS | Status: AC
  Filled 2015-03-17: qty 0.9

## 2015-03-17 MED ORDER — LIDOCAINE HCL (PF) 1 % IJ SOLN
INTRAMUSCULAR | Status: AC
  Filled 2015-03-17: qty 5

## 2015-03-17 MED ORDER — TRAMADOL HCL 50 MG PO TABS: 50.0000 mg | ORAL_TABLET | Freq: Four times a day (QID) | ORAL | Status: DC | PRN

## 2015-03-17 MED ORDER — TRAMADOL HCL 50 MG PO TABS
50.0000 mg | ORAL_TABLET | Freq: Once | ORAL | Status: AC
  Administered 2015-03-17: 50 mg via ORAL
  Filled 2015-03-17: qty 1

## 2015-03-17 NOTE — ED Notes (Signed)
States her legs gave out and she tripped.  Pain and lac to left hand.

## 2015-03-17 NOTE — Discharge Instructions (Signed)

## 2015-03-17 NOTE — ED Notes (Signed)
FNP at bedside.

## 2015-03-17 NOTE — ED Provider Notes (Signed)
[facility] Emergency Department Provider Note ____________________________________________  Time seen: Approximately 11:21 AM  I have reviewed the triage vital signs and the nursing notes.   HISTORY  Chief Complaint Hand Injury   HPI Kristi Brown is a 82 y.o. female who presents to the emergency department for evaluation after falling this morning. She states her body "just gave out" after stepping up onto the porch. She denies striking her head or loss of consciousness. She states she has falling spells when her "plates are low." Last lab work was about a month ago.   Past Medical History  Diagnosis Date  . Diabetes mellitus without complication ([place])   . Hypertension     There are no active problems to display for this patient.   History reviewed. No pertinent past surgical history.  No current outpatient prescriptions on file.  Allergies Review of patient's allergies indicates no known allergies.  History reviewed. No pertinent family history.  Social History Social History  Substance Use Topics  . Smoking status: Never Smoker   . Smokeless tobacco: None  . Alcohol Use: None    Review of Systems Constitutional: No fever/chills Eyes: No visual changes. Cardiovascular: Denies chest pain. Respiratory: Denies shortness of breath. Gastrointestinal: No abdominal pain.  No nausea, no vomiting.  No diarrhea.  No constipation. Musculoskeletal: Positive for back pain  Skin: Positive for laceration to right hand Neurological: Negative for headaches, focal weakness or numbness.   ____________________________________________   PHYSICAL EXAM:  VITAL SIGNS: ED Triage Vitals  Enc Vitals Group     BP 03/17/15 1106 184/52 mmHg     Pulse Rate 03/17/15 1104 64     Resp 03/17/15 1104 16     Temp 03/17/15 1104 98 F (36.7 C)     Temp src --      SpO2 03/17/15 1104 96 %     Weight 03/17/15 1104 170 lb (77.111 kg)     Height 03/17/15 1104 5\' 5"   (1.651 m)     Head Cir --      Peak Flow --      Pain Score 03/17/15 1104 8     Pain Loc --      Pain Edu? --      Excl. in [place]? --     Constitutional: Alert and oriented. Well appearing and in no acute distress. Eyes: Conjunctivae are normal. PERRL. EOMI. Head: Atraumatic. Nose: No congestion/rhinnorhea. Neck: No stridor.  Nexus criteria negative. Cardiovascular: Normal rate, regular rhythm. Grossly normal heart sounds.  Good peripheral circulation. Respiratory: Normal respiratory effort.  No retractions. Lungs CTAB. Gastrointestinal: Soft and nontender. No distention. Musculoskeletal: No lower extremity tenderness nor edema.  No joint effusions. Neurologic:  Normal speech and language. No gross focal neurologic deficits are appreciated. No gait instability. Skin:  Laceration to palmar surface of right hand at the base of the little finger. Psychiatric: Mood and affect are normal. Speech and behavior are normal.  ____________________________________________   LABS (all labs ordered are listed, but only abnormal results are displayed)  Labs Reviewed  CBC WITH DIFFERENTIAL/PLATELET - Abnormal; Notable for the following:    RBC 3.61 (*)    Hemoglobin 11.5 (*)    HCT 33.2 (*)    All other components within normal limits  COMPREHENSIVE METABOLIC PANEL - Abnormal; Notable for the following:    Chloride 98 (*)    Glucose, Bld 118 (*)    BUN 22 (*)    Creatinine, Ser 1.27 (*)  GFR calc non Af Amer 38 (*)    GFR calc Af Amer 44 (*)    All other components within normal limits   ____________________________________________  EKG   ____________________________________________  RADIOLOGY  Right hand negative for bony abnormality. Lumbar spine films negative for acute changes.  I, Sherrie George, personally viewed and evaluated these images (plain radiographs) as part of my medical decision making, as well as reviewing the written report by the  radiologist.  ____________________________________________   PROCEDURES  Procedure(s) performed:  LACERATION REPAIR Performed by: Sherrie George Authorized by: Sherrie George Consent: Verbal consent obtained. Risks and benefits: risks, benefits and alternatives were discussed Consent given by: patient Patient identity confirmed: provided demographic data Prepped and Draped in normal sterile fashion Wound explored  Laceration Location: Palmar aspect of right hand below small finger.  Laceration Length: 2.5cm  No Foreign Bodies seen or palpated  Anesthesia: local infiltration  Local anesthetic: lidocaine 1% with epinephrine  Anesthetic total: 5 ml  Irrigation method: syringe Amount of cleaning: standard  Skin closure: 5-0 nylon  Number of sutures: 7  Technique: Simple interrupted  Patient tolerance: Patient tolerated the procedure well with no immediate complications.   Critical Care performed: No  ____________________________________________   INITIAL IMPRESSION / ASSESSMENT AND PLAN / ED COURSE  Pertinent labs & imaging results that were available during my care of the patient were reviewed by me and considered in my medical decision making (see chart for details). Labs and x-ray results discussed with the patient and her daughter.  Wound care instructions discussed. Patient was advised to follow up with her PCP for removal in 10 days. She is to schedule a follow up for back pain that does not improve with tylenol arthritis and tramadol. She was instructed to return to the ER for symptoms that change or worsen or for new concerns.  ____________________________________________   FINAL CLINICAL IMPRESSION(S) / ED DIAGNOSES  Final diagnoses:  Acute lumbar back pain      Victorino Dike, FNP 03/17/15 1413  Lisa Roca, MD 03/17/15 1530

## 2015-05-03 DIAGNOSIS — E11319 Type 2 diabetes mellitus with unspecified diabetic retinopathy without macular edema: Secondary | ICD-10-CM | POA: Insufficient documentation

## 2015-11-30 DIAGNOSIS — C7A010 Malignant carcinoid tumor of the duodenum: Secondary | ICD-10-CM | POA: Insufficient documentation

## 2016-01-25 ENCOUNTER — Encounter: Payer: Self-pay | Admitting: Gynecology

## 2016-01-25 ENCOUNTER — Ambulatory Visit (INDEPENDENT_AMBULATORY_CARE_PROVIDER_SITE_OTHER): Payer: Medicare HMO

## 2016-01-25 ENCOUNTER — Ambulatory Visit
Admission: EM | Admit: 2016-01-25 | Discharge: 2016-01-25 | Disposition: A | Discharge status: Home / Self Care | Payer: Medicare HMO | Attending: Family Medicine | Admitting: Family Medicine

## 2016-01-25 DIAGNOSIS — R05 Cough: Secondary | ICD-10-CM | POA: Diagnosis present

## 2016-01-25 DIAGNOSIS — M5412 Radiculopathy, cervical region: Secondary | ICD-10-CM

## 2016-01-25 DIAGNOSIS — J40 Bronchitis, not specified as acute or chronic: Secondary | ICD-10-CM

## 2016-01-25 DIAGNOSIS — I10 Essential (primary) hypertension: Secondary | ICD-10-CM | POA: Insufficient documentation

## 2016-01-25 DIAGNOSIS — E079 Disorder of thyroid, unspecified: Secondary | ICD-10-CM | POA: Insufficient documentation

## 2016-01-25 DIAGNOSIS — I700 Atherosclerosis of aorta: Secondary | ICD-10-CM | POA: Diagnosis not present

## 2016-01-25 DIAGNOSIS — I517 Cardiomegaly: Secondary | ICD-10-CM | POA: Insufficient documentation

## 2016-01-25 DIAGNOSIS — I160 Hypertensive urgency: Secondary | ICD-10-CM

## 2016-01-25 DIAGNOSIS — E119 Type 2 diabetes mellitus without complications: Secondary | ICD-10-CM | POA: Diagnosis not present

## 2016-01-25 DIAGNOSIS — K219 Gastro-esophageal reflux disease without esophagitis: Secondary | ICD-10-CM | POA: Diagnosis not present

## 2016-01-25 DIAGNOSIS — M79602 Pain in left arm: Secondary | ICD-10-CM | POA: Diagnosis not present

## 2016-01-25 DIAGNOSIS — Z7984 Long term (current) use of oral hypoglycemic drugs: Secondary | ICD-10-CM | POA: Diagnosis not present

## 2016-01-25 HISTORY — DX: Disorder of thyroid, unspecified: E07.9

## 2016-01-25 HISTORY — DX: Gastro-esophageal reflux disease without esophagitis: K21.9

## 2016-01-25 MED ORDER — DOXYCYCLINE HYCLATE 100 MG PO CAPS: 100.0000 mg | ORAL_CAPSULE | Freq: Two times a day (BID) | ORAL | 0 refills | Status: DC

## 2016-01-25 MED ORDER — HYDROCOD POLST-CPM POLST ER 10-8 MG/5ML PO SUER: 5.0000 mL | Freq: Two times a day (BID) | ORAL | 0 refills | Status: DC | PRN

## 2016-01-25 MED ORDER — AMLODIPINE BESYLATE 5 MG PO TABS: 5.0000 mg | ORAL_TABLET | Freq: Every day | ORAL | 0 refills | Status: DC

## 2016-01-25 NOTE — ED Provider Notes (Signed)
[facility]    CSN: [id number] Arrival date & time: 01/25/16  1615  History   Chief Complaint Chief Complaint  Patient presents with  . Cough  . Arm Pain   HPI  83 year old female with hypertension, diabetes, thyroid disease, neuroendocrine carcinoma who is currently undergoing chemotherapy presents with complaints of cough. She also has complaints of arm pain.  Patient reports a one-week history of cough. Mildly productive of white sputum. No associated shortness of breath. No fever. She been using an over-the-counter cough medication without improvement. No other associated symptoms.   Additionally, patient reports left arm pain. Is sharp in character. Also described as burning/numb/tingling. This appears to be an ongoing issue. She has brought this up to her oncologist who feels that it is probably secondary to cervical radiculopathy. Oncologist is commented about proceeding with MRI and possibly bone scan if symptoms persist. Currently not having much pain in her left arm.  Also, patient not endorsing any issues regarding hypertension but her blood pressure is markedly elevated today. No reports of shortness of breath. She's had some chest discomfort from the cough. No cardiac chest pain. No vision changes.   Past Medical History:  Diagnosis Date  . Diabetes mellitus without complication ([place])   . GERD (gastroesophageal reflux disease)   . Hypertension   . Thyroid disease    There are no active problems to display for this patient.  Past Surgical History:  Procedure Laterality Date  . ABDOMINAL HYSTERECTOMY     OB History    No data available     Home Medications    Prior to Admission medications   Medication Sig Start Date End Date Taking? Authorizing Provider  esomeprazole (NEXIUM) 40 MG capsule Take 40 mg by mouth daily at 12 noon.   Yes Historical Provider, MD  fluticasone (FLONASE) 50 MCG/ACT nasal spray Place into both nostrils daily.   Yes Historical  Provider, MD  hydrochlorothiazide (MICROZIDE) 12.5 MG capsule Take 12.5 mg by mouth daily.   Yes Historical Provider, MD  Lancets MISC by Does not apply route.   Yes Historical Provider, MD  levothyroxine (SYNTHROID, LEVOTHROID) 25 MCG tablet Take 25 mcg by mouth daily before breakfast.   Yes Historical Provider, MD  metFORMIN (GLUCOPHAGE) 1000 MG tablet Take 1,000 mg by mouth 2 (two) times daily with a meal.   Yes Historical Provider, MD  amLODipine (NORVASC) 5 MG tablet Take 1 tablet (5 mg total) by mouth daily. 01/25/16   Coral Spikes, DO  chlorpheniramine-HYDROcodone (TUSSIONEX PENNKINETIC ER) 10-8 MG/5ML SUER Take 5 mLs by mouth every 12 (twelve) hours as needed for cough. 01/25/16   Coral Spikes, DO  doxycycline (VIBRAMYCIN) 100 MG capsule Take 1 capsule (100 mg total) by mouth 2 (two) times daily. 01/25/16   Coral Spikes, DO  traMADol (ULTRAM) 50 MG tablet Take 1 tablet (50 mg total) by mouth every 6 (six) hours as needed. 03/17/15   Victorino Dike, FNP   Family History History reviewed. No pertinent family history.  Social History Social History  Substance Use Topics  . Smoking status: Never Smoker  . Smokeless tobacco: Never Used  . Alcohol use No   Allergies   Patient has no known allergies.   Review of Systems Review of Systems  Respiratory: Positive for cough and chest tightness.   Musculoskeletal:       Left arm pain.  Neurological: Positive for numbness.  All other systems reviewed and are negative.  Physical Exam  Triage Vital Signs ED Triage Vitals  Enc Vitals Group     BP 01/25/16 1732 (!) 223/75     Pulse Rate 01/25/16 1732 65     Resp 01/25/16 1732 16     Temp 01/25/16 1732 97.6 F (36.4 C)     Temp Source 01/25/16 1732 Oral     SpO2 01/25/16 1732 98 %     Weight 01/25/16 1740 182 lb (82.6 kg)     Height 01/25/16 1740 5\' 5"  (1.651 m)     Head Circumference --      Peak Flow --      Pain Score 01/25/16 1744 9     Pain Loc --      Pain Edu? --      Excl. in  [place]? --    Updated Vital Signs BP (!) 200/80 (BP Location: Right Arm)   Pulse 65   Temp 97.6 F (36.4 C) (Oral)   Resp 16   Ht 5\' 5"  (1.651 m)   Wt 182 lb (82.6 kg)   SpO2 98%   BMI 30.29 kg/m     Physical Exam  Constitutional: She is oriented to person, place, and time. She appears well-developed. No distress.  HENT:  Head: Normocephalic and atraumatic.  Eyes: Conjunctivae are normal.  Neck: Neck supple.  Cardiovascular: Normal rate and regular rhythm.   Soft systolic murmur.  Pulmonary/Chest: Effort normal.  Right basilar wheezing noted.  Abdominal: Soft. She exhibits no distension. There is no tenderness.  Neurological: She is alert and oriented to person, place, and time.  Skin: No rash noted.  Psychiatric: She has a normal mood and affect.  Vitals reviewed.  UC Treatments / Results  Labs (all labs ordered are listed, but only abnormal results are displayed) Labs Reviewed - No data to display  EKG  EKG Interpretation None       Radiology Dg Chest 2 View  Result Date: 01/25/2016 CLINICAL DATA:  Cough for 1 week. EXAM: CHEST  2 VIEW COMPARISON:  Single-view of the chest 05/24/2007. FINDINGS: There is cardiomegaly without edema. No pneumothorax pleural effusion. Aortic atherosclerosis noted. No acute bony abnormality. IMPRESSION: No acute disease. Cardiomegaly. Atherosclerosis. Electronically Signed   By: Inge Rise M.D.   On: 01/25/2016 19:01   Procedures Procedures (including critical care time)  Medications Ordered in UC Medications - No data to display  Initial Impression / Assessment and Plan / UC Course  I have reviewed the triage vital signs and the nursing notes.  Pertinent labs & imaging results that were available during my care of the patient were reviewed by me and considered in my medical decision making (see chart for details).  Clinical Course    83 year old female presents with complaints of cough. Also having left arm pain.  Additionally, found to have severely elevated blood pressure.  Chest x-ray negative. Suspect acute bronchitis. Given immunosuppression, treating empirically with doxycycline. Tussionex for cough. Regarding her left arm pain, this appears to be coming from cervical radiculopathy. Patient should follow-up with her primary an oncologist for further workup with imaging. BP severely elevated today. She has no signs of an organ damage. She has had labs 3 weeks ago. Given her parents and lack of objective findings consistent with end organ damage, this appears to be hypertensive urgency. Patient is to continue her HCTZ. I'm adding Norvasc. Caregiver to check her blood pressure at home. If fails to improve, she should go to the emergency department.  Final Clinical Impressions(s) /  UC Diagnoses   Final diagnoses:  Bronchitis  Cervical radiculopathy  Hypertensive urgency   New Prescriptions New Prescriptions   AMLODIPINE (NORVASC) 5 MG TABLET    Take 1 tablet (5 mg total) by mouth daily.   CHLORPHENIRAMINE-HYDROCODONE (TUSSIONEX PENNKINETIC ER) 10-8 MG/5ML SUER    Take 5 mLs by mouth every 12 (twelve) hours as needed for cough.   DOXYCYCLINE (VIBRAMYCIN) 100 MG CAPSULE    Take 1 capsule (100 mg total) by mouth 2 (two) times daily.     Coral Spikes, DO 01/25/16 1919

## 2016-01-25 NOTE — Discharge Instructions (Signed)
Antibiotic as prescribed.  Cough medication as needed.  If she worsens, she needs to go to the hospital. BP checks at home. Pressure should slowly come down.  Take care  Dr. Lacinda Axon

## 2016-01-25 NOTE — ED Triage Notes (Signed)
Patient c/o coughing x 1 week. Per patient when coughing chest hurts. Patient also stated left arm pain x 2 weeks.

## 2016-02-28 DIAGNOSIS — N185 Chronic kidney disease, stage 5: Secondary | ICD-10-CM | POA: Insufficient documentation

## 2016-02-28 DIAGNOSIS — R55 Syncope and collapse: Secondary | ICD-10-CM | POA: Insufficient documentation

## 2016-02-28 DIAGNOSIS — N189 Chronic kidney disease, unspecified: Secondary | ICD-10-CM | POA: Insufficient documentation

## 2016-02-28 DIAGNOSIS — E039 Hypothyroidism, unspecified: Secondary | ICD-10-CM | POA: Insufficient documentation

## 2016-02-28 DIAGNOSIS — K219 Gastro-esophageal reflux disease without esophagitis: Secondary | ICD-10-CM | POA: Insufficient documentation

## 2016-06-18 IMAGING — CR DG LUMBAR SPINE 2-3V
3 series · 3 of 3 positions shown · non-contrast
Comparison: 08/01/2013 and prior radiographs

CLINICAL DATA: 85-year-old female with acute lumbar spine pain
following fall today. Initial encounter.

EXAM:
LUMBAR SPINE - 2-3 VIEW

[l-spine ap]
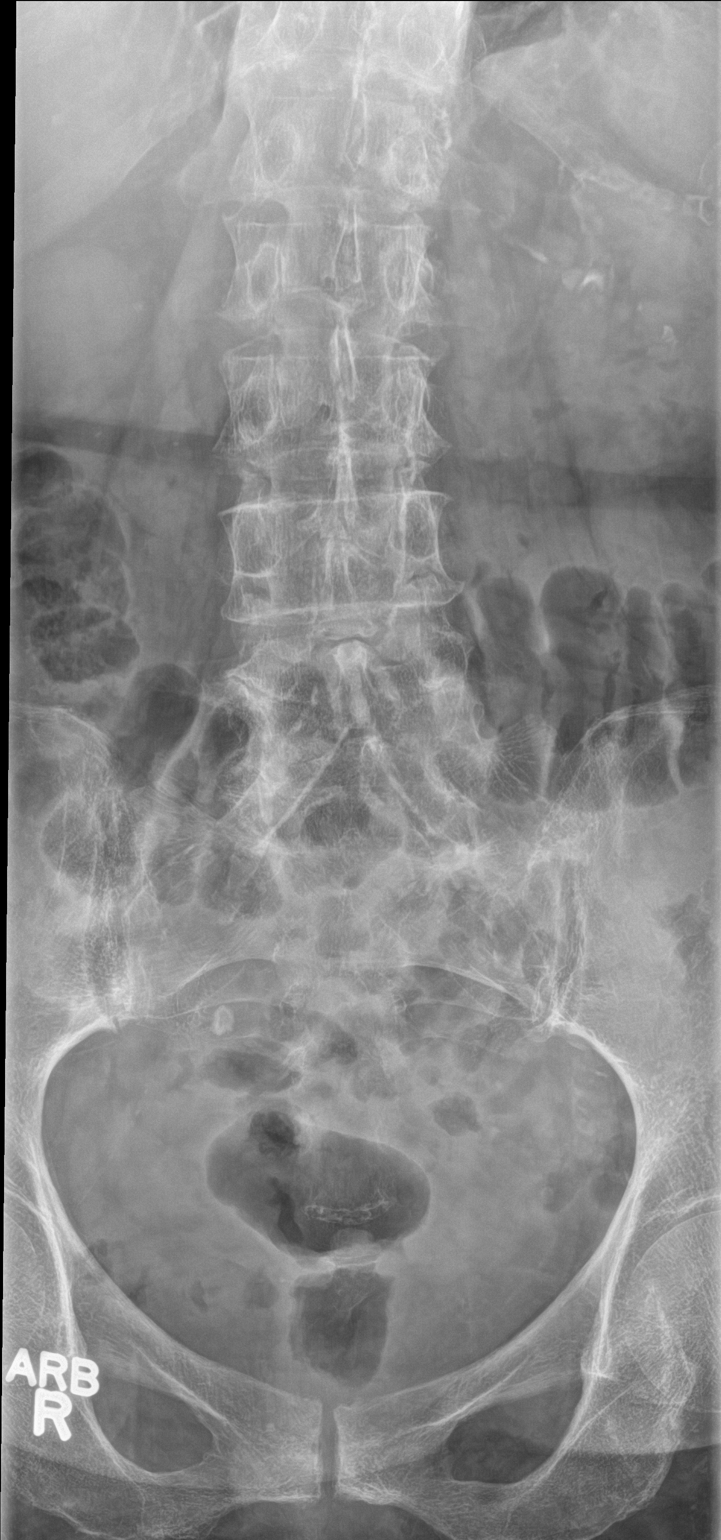

[l-spine lat]
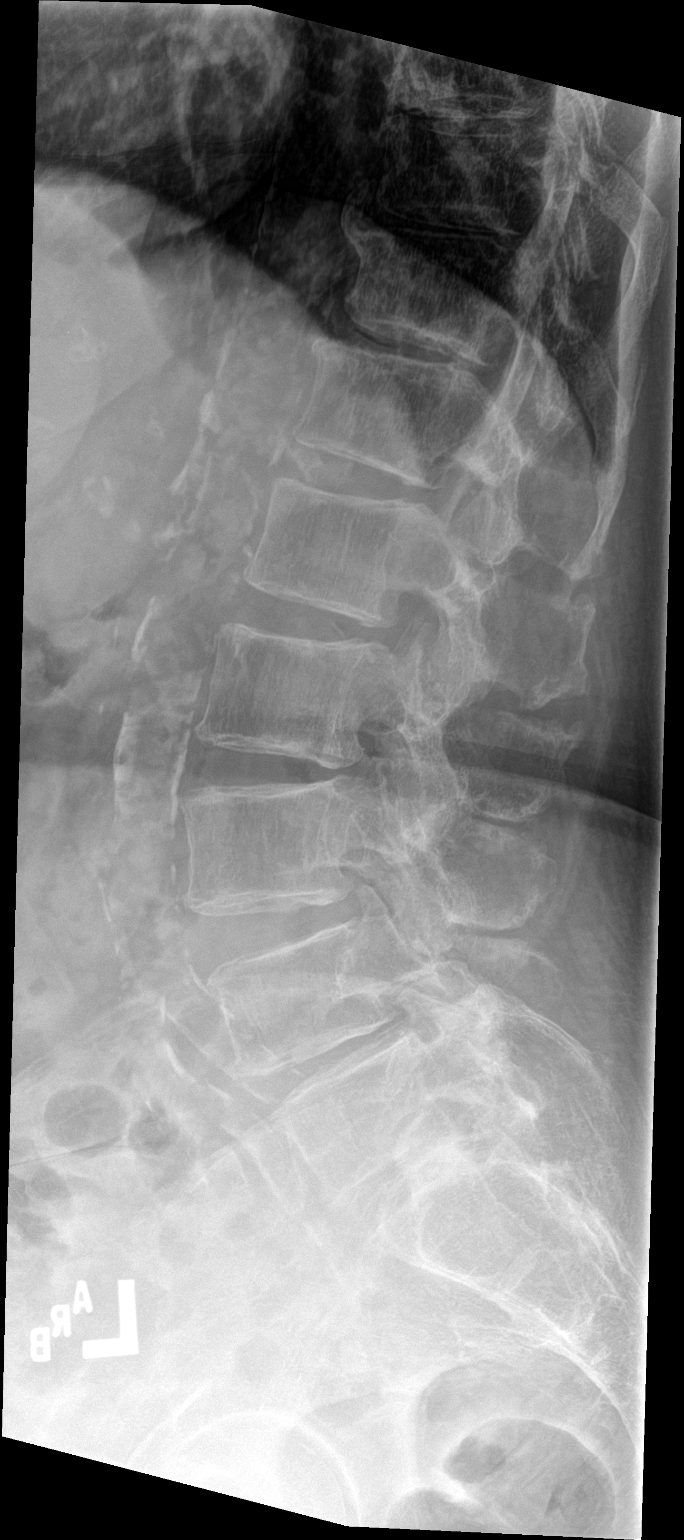

[l-spine spot]
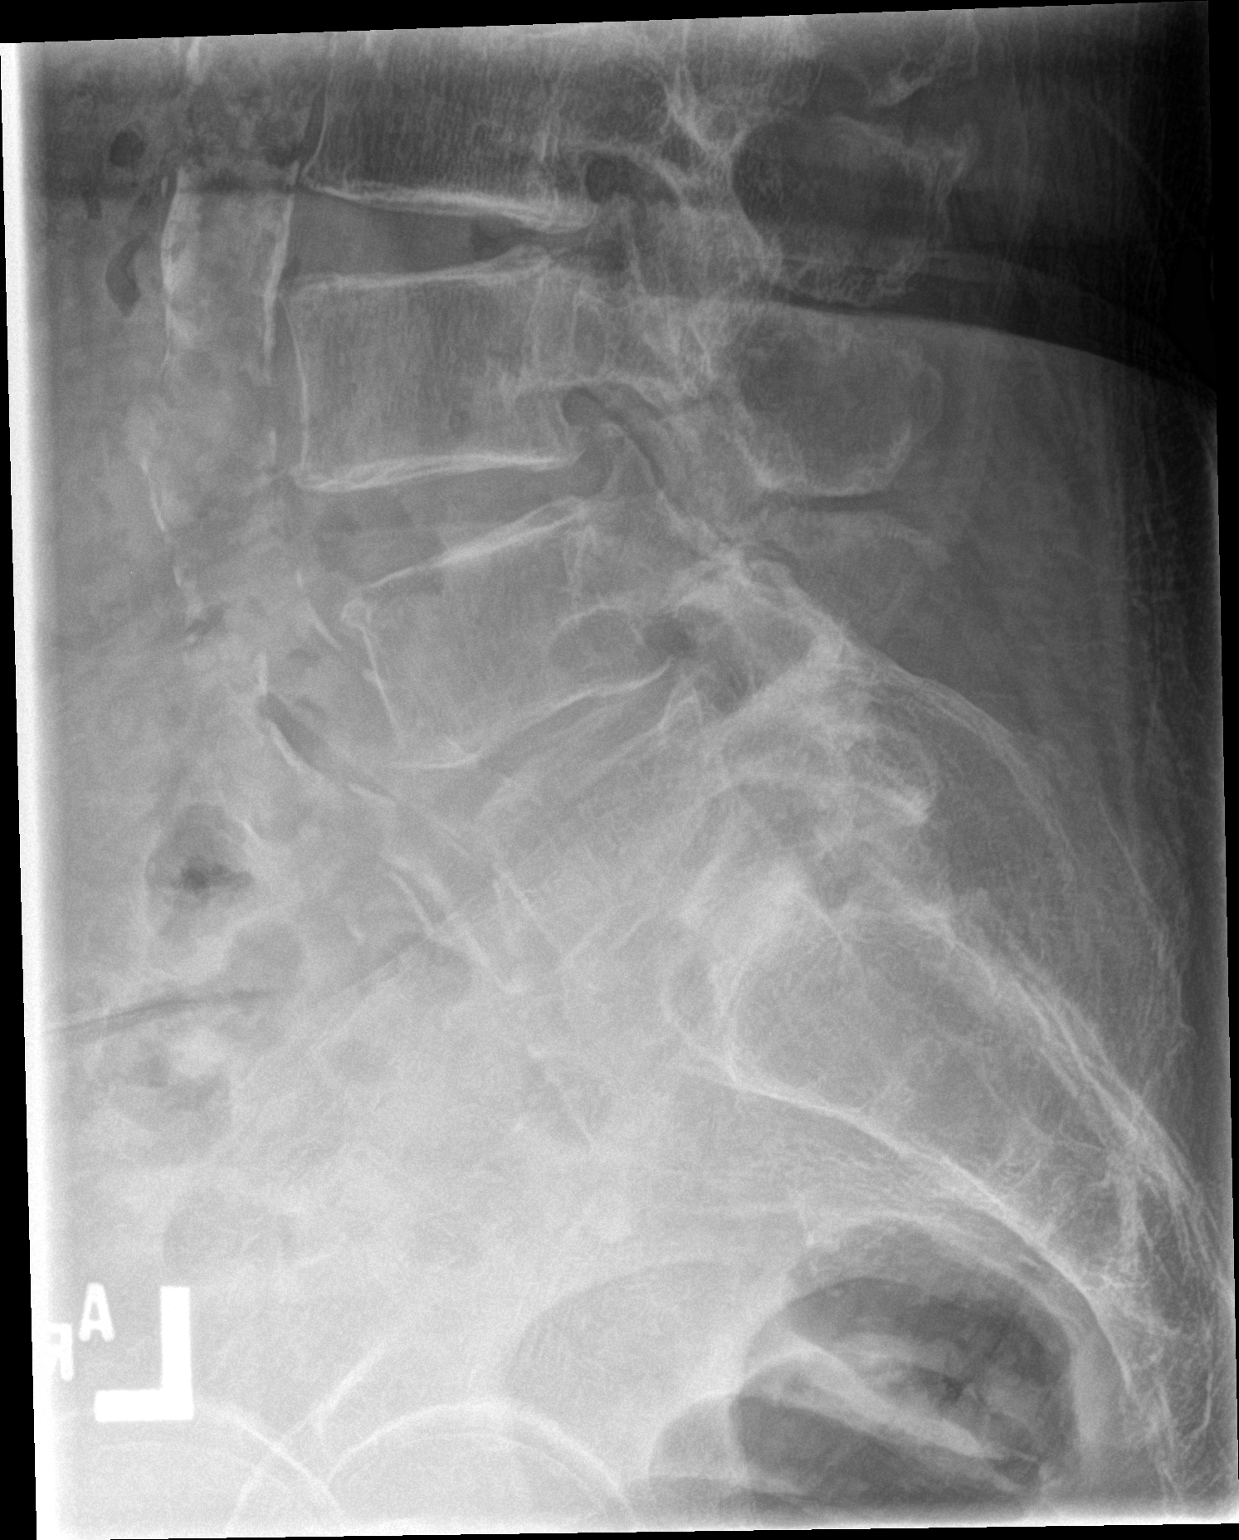

[3 of 3 positions shown; findings below may reference images not displayed]

FINDINGS: There is no evidence of acute fracture, there is no evidence of
acute fracture or subluxation.

Mild multilevel degenerative disc disease and facet arthropathy
again identified.

No focal bony lesions are present.

Aortic atherosclerotic calcifications identified.
IMPRESSION: No evidence of acute abnormality.

Multilevel degenerative changes.

## 2016-07-15 DIAGNOSIS — D509 Iron deficiency anemia, unspecified: Secondary | ICD-10-CM | POA: Insufficient documentation

## 2016-07-15 DIAGNOSIS — E559 Vitamin D deficiency, unspecified: Secondary | ICD-10-CM | POA: Insufficient documentation

## 2017-05-29 ENCOUNTER — Other Ambulatory Visit: Payer: Self-pay

## 2017-05-29 ENCOUNTER — Ambulatory Visit (INDEPENDENT_AMBULATORY_CARE_PROVIDER_SITE_OTHER): Payer: Medicare HMO

## 2017-05-29 ENCOUNTER — Ambulatory Visit
Admission: EM | Admit: 2017-05-29 | Discharge: 2017-05-29 | Disposition: A | Discharge status: Home / Self Care | Payer: Medicare HMO | Attending: Family Medicine | Admitting: Family Medicine

## 2017-05-29 DIAGNOSIS — J181 Lobar pneumonia, unspecified organism: Secondary | ICD-10-CM

## 2017-05-29 DIAGNOSIS — R6883 Chills (without fever): Secondary | ICD-10-CM

## 2017-05-29 DIAGNOSIS — R0989 Other specified symptoms and signs involving the circulatory and respiratory systems: Secondary | ICD-10-CM

## 2017-05-29 DIAGNOSIS — R05 Cough: Secondary | ICD-10-CM | POA: Diagnosis not present

## 2017-05-29 DIAGNOSIS — J189 Pneumonia, unspecified organism: Secondary | ICD-10-CM

## 2017-05-29 MED ORDER — CEFUROXIME AXETIL 500 MG PO TABS: 500.0000 mg | ORAL_TABLET | Freq: Two times a day (BID) | ORAL | 0 refills | Status: DC

## 2017-05-29 MED ORDER — DOXYCYCLINE HYCLATE 100 MG PO TABS: 100.0000 mg | ORAL_TABLET | Freq: Two times a day (BID) | ORAL | 0 refills | Status: DC

## 2017-05-29 NOTE — ED Triage Notes (Signed)
Patient complains of productive cough x 2 days.

## 2017-05-29 NOTE — ED Provider Notes (Signed)
[facility]    CSN: [id number] Arrival date & time: 05/29/17  1025     History   Chief Complaint Chief Complaint  Patient presents with  . Cough    HPI Kristi Brown is a 84 y.o. female.   The history is provided by the patient.  Cough  Cough characteristics:  Productive Severity:  Moderate Onset quality:  Sudden Duration:  2 days Timing:  Constant Progression:  Worsening Chronicity:  New Smoker: no   Context comment:  Unknown Relieved by:  Nothing Ineffective treatments:  Cough suppressants Associated symptoms: chills   Associated symptoms: no chest pain, no diaphoresis, no ear fullness, no ear pain, no eye discharge, no fever, no headaches, no myalgias, no rash, no rhinorrhea, no shortness of breath, no sinus congestion, no sore throat, no weight loss and no wheezing     Past Medical History:  Diagnosis Date  . Diabetes mellitus without complication ([place])   . GERD (gastroesophageal reflux disease)   . Hypertension   . Thyroid disease     There are no active problems to display for this patient.   Past Surgical History:  Procedure Laterality Date  . ABDOMINAL HYSTERECTOMY      OB History   None      Home Medications    Prior to Admission medications   Medication Sig Start Date End Date Taking? Authorizing Provider  albuterol (PROVENTIL HFA) 108 (90 Base) MCG/ACT inhaler Inhale into the lungs. 01/30/17  Yes [provider]  amLODipine (NORVASC) 5 MG tablet Take 1 tablet (5 mg total) by mouth daily. 01/25/16  Yes Cook, Jayce G, DO  carvedilol (COREG) 25 MG tablet Take 25 mg by mouth 2 (two) times daily. 03/11/17  Yes [provider]  esomeprazole (NEXIUM) 40 MG capsule Take 40 mg by mouth daily at 12 noon.   Yes [provider]  fluticasone (FLONASE) 50 MCG/ACT nasal spray Place into both nostrils daily.   Yes [provider]  glipiZIDE (GLUCOTROL XL) 2.5 MG 24 hr tablet Take by mouth. 03/08/17 03/08/18 Yes  [provider]  hydrochlorothiazide (MICROZIDE) 12.5 MG capsule Take 12.5 mg by mouth daily.   Yes [provider]  Lancets MISC by Does not apply route.   Yes [provider]  levothyroxine (SYNTHROID, LEVOTHROID) 25 MCG tablet Take 25 mcg by mouth daily before breakfast.   Yes [provider]  lisinopril (PRINIVIL,ZESTRIL) 10 MG tablet Take 20 mg by mouth daily. 03/11/17  Yes [provider]  metFORMIN (GLUCOPHAGE) 1000 MG tablet Take 1,000 mg by mouth 2 (two) times daily with a meal.   Yes [provider]  Multiple Vitamins-Minerals (PRESERVISION AREDS 2 PO) Take by mouth.   Yes [provider]  pravastatin (PRAVACHOL) 40 MG tablet Take 40 mg by mouth daily. 03/11/17  Yes [provider]  cefUROXime (CEFTIN) 500 MG tablet Take 1 tablet (500 mg total) by mouth 2 (two) times daily with a meal. 05/29/17   Norval Gable, MD  chlorpheniramine-HYDROcodone (TUSSIONEX PENNKINETIC ER) 10-8 MG/5ML SUER Take 5 mLs by mouth every 12 (twelve) hours as needed for cough. 01/25/16   Coral Spikes, DO  doxycycline (VIBRA-TABS) 100 MG tablet Take 1 tablet (100 mg total) by mouth 2 (two) times daily. 05/29/17   Norval Gable, MD  traMADol (ULTRAM) 50 MG tablet Take 1 tablet (50 mg total) by mouth every 6 (six) hours as needed. 03/17/15   Victorino Dike, FNP    Family History  Family History  Problem Relation Age of Onset  . Cancer Father     Social History Social History   Tobacco Use  . Smoking status: Never Smoker  . Smokeless tobacco: Never Used  Substance Use Topics  . Alcohol use: No  . Drug use: Never     Allergies   Patient has no known allergies.   Review of Systems Review of Systems  Constitutional: Positive for chills. Negative for diaphoresis, fever and weight loss.  HENT: Negative for ear pain, rhinorrhea and sore throat.   Eyes: Negative for discharge.  Respiratory: Positive for cough. Negative for shortness of  breath and wheezing.   Cardiovascular: Negative for chest pain.  Musculoskeletal: Negative for myalgias.  Skin: Negative for rash.  Neurological: Negative for headaches.     Physical Exam Triage Vital Signs ED Triage Vitals  Enc Vitals Group     BP 05/29/17 1047 (!) 114/56     Pulse Rate 05/29/17 1047 72     Resp 05/29/17 1047 18     Temp 05/29/17 1047 98.6 F (37 C)     Temp Source 05/29/17 1047 Oral     SpO2 05/29/17 1047 98 %     Weight 05/29/17 1043 175 lb (79.4 kg)     Height 05/29/17 1043 5\' 5"  (1.651 m)     Head Circumference --      Peak Flow --      Pain Score 05/29/17 1042 10     Pain Loc --      Pain Edu? --      Excl. in [place]? --    No data found.  Updated Vital Signs BP (!) 114/56 (BP Location: Left Arm)   Pulse 72   Temp 98.6 F (37 C) (Oral)   Resp 18   Ht 5\' 5"  (1.651 m)   Wt 175 lb (79.4 kg)   SpO2 98%   BMI 29.12 kg/m   Visual Acuity Right Eye Distance:   Left Eye Distance:   Bilateral Distance:    Right Eye Near:   Left Eye Near:    Bilateral Near:     Physical Exam  Constitutional: She appears well-developed and well-nourished. No distress.  HENT:  Head: Normocephalic and atraumatic.  Right Ear: Tympanic membrane, external ear and ear canal normal.  Left Ear: Tympanic membrane, external ear and ear canal normal.  Nose: No mucosal edema, rhinorrhea, nose lacerations, sinus tenderness, nasal deformity, septal deviation or nasal septal hematoma. No epistaxis.  No foreign bodies. Right sinus exhibits no maxillary sinus tenderness and no frontal sinus tenderness. Left sinus exhibits no maxillary sinus tenderness and no frontal sinus tenderness.  Mouth/Throat: Uvula is midline, oropharynx is clear and moist and mucous membranes are normal. No oropharyngeal exudate.  Eyes: Pupils are equal, round, and reactive to light. Conjunctivae and EOM are normal. Right eye exhibits no discharge. Left eye exhibits no discharge. No scleral icterus.  Neck:  Normal range of motion. Neck supple. No thyromegaly present.  Cardiovascular: Normal rate, regular rhythm and normal heart sounds.  Pulmonary/Chest: Effort normal. No stridor. No respiratory distress. She has no wheezes. She has rales (right).  Lymphadenopathy:    She has no cervical adenopathy.  Skin: She is not diaphoretic.  Nursing note and vitals reviewed.    UC Treatments / Results  Labs (all labs ordered are listed, but only abnormal results are displayed) Labs Reviewed - No data to display  EKG None  Radiology Dg Chest 2 View  Result  Date: 05/29/2017 CLINICAL DATA:  Productive cough over the last 3-4 days. EXAM: CHEST - 2 VIEW COMPARISON:  01/25/2016. FINDINGS: Chronic cardiomegaly and aortic atherosclerosis. The upper lungs are clear. There is mild patchy infiltrate in the right middle lobe and/or lingula. No dense consolidation or lobar collapse. No effusions. Ordinary degenerative changes affect the spine. IMPRESSION: Mild patchy infiltrate in the right middle lobe and lingula, barely discernible on the lateral view. No advanced consolidation or collapse. Electronically Signed   By: Nelson Chimes M.D.   On: 05/29/2017 12:06    Procedures Procedures (including critical care time)  Medications Ordered in UC Medications - No data to display  Initial Impression / Assessment and Plan / UC Course  I have reviewed the triage vital signs and the nursing notes.  Pertinent labs & imaging results that were available during my care of the patient were reviewed by me and considered in my medical decision making (see chart for details).     Final Clinical Impressions(s) / UC Diagnoses   Final diagnoses:  Community acquired pneumonia of right middle lobe of lung [facility])     Discharge Instructions     Follow up with Primary Care provider on Monday Go to [facility] Emergency Department if symptoms worsen over the weekend    ED Prescriptions    Medication Sig Dispense Auth.  Provider   doxycycline (VIBRA-TABS) 100 MG tablet Take 1 tablet (100 mg total) by mouth 2 (two) times daily. 14 tablet Terricka Onofrio, Linward Foster, MD   cefUROXime (CEFTIN) 500 MG tablet Take 1 tablet (500 mg total) by mouth 2 (two) times daily with a meal. 14 tablet Norval Gable, MD      1. x-ray results and diagnosis reviewed with patient and daughter  2. rx as per orders above; reviewed possible side effects, interactions, risks and benefits  3. Recommend supportive treatment with otc analgesics prn, rest, fluids 4. Follow-up prn if symptoms worsen or don't improve  Controlled Substance Prescriptions [place] Controlled Substance Registry consulted? Not Applicable   Norval Gable, MD 05/29/17 [phone]

## 2017-05-29 NOTE — Discharge Instructions (Signed)
Follow up with Primary Care provider on Monday Go to [facility] Emergency Department if symptoms worsen over the weekend

## 2017-07-03 DIAGNOSIS — N189 Chronic kidney disease, unspecified: Secondary | ICD-10-CM | POA: Insufficient documentation

## 2017-09-11 ENCOUNTER — Ambulatory Visit
Admission: EM | Admit: 2017-09-11 | Discharge: 2017-09-11 | Disposition: A | Discharge status: Home / Self Care | Payer: Medicare HMO | Attending: Family Medicine | Admitting: Family Medicine

## 2017-09-11 ENCOUNTER — Other Ambulatory Visit: Payer: Self-pay

## 2017-09-11 ENCOUNTER — Encounter: Payer: Self-pay | Admitting: Emergency Medicine

## 2017-09-11 DIAGNOSIS — R300 Dysuria: Secondary | ICD-10-CM

## 2017-09-11 LAB — URINALYSIS, COMPLETE (UACMP) WITH MICROSCOPIC
BILIRUBIN URINE: NEGATIVE
GLUCOSE, UA: NEGATIVE mg/dL
Ketones, ur: NEGATIVE mg/dL
NITRITE: NEGATIVE
Protein, ur: >300 mg/dL — AB
RBC / HPF: >50 RBC/hpf (ref 0–5)
Specific Gravity, Urine: 1.02 (ref 1.005–1.030)
Squamous Epithelial / HPF: NONE SEEN (ref 0–5)
WBC, UA: >50 WBC/hpf (ref 0–5)
pH: 5.5 (ref 5.0–8.0)

## 2017-09-11 MED ORDER — CEPHALEXIN 500 MG PO CAPS: 500.0000 mg | ORAL_CAPSULE | Freq: Two times a day (BID) | ORAL | 0 refills | Status: DC

## 2017-09-11 MED ORDER — PHENAZOPYRIDINE HCL 100 MG PO TABS: 100.0000 mg | ORAL_TABLET | Freq: Three times a day (TID) | ORAL | 0 refills | Status: DC

## 2017-09-11 NOTE — ED Provider Notes (Signed)
[facility]    CSN: [id number] Arrival date & time: 09/11/17  1201     History   Chief Complaint Chief Complaint  Patient presents with  . Dysuria    HPI Kristi Brown is a 84 y.o. female.   HPI  84 year old female accompanied by her daughter presents with painful urination and low back pain that started about 3 days ago.  She is had no nausea or vomiting has had no fever or chills.  She is afebrile today.  Had a UTI before that she remembers.  Her main complaint is that of painful urination and suprapubic discomfort.         Past Medical History:  Diagnosis Date  . Diabetes mellitus without complication ([place])   . GERD (gastroesophageal reflux disease)   . Hypertension   . Thyroid disease     There are no active problems to display for this patient.   Past Surgical History:  Procedure Laterality Date  . ABDOMINAL HYSTERECTOMY      OB History   None      Home Medications    Prior to Admission medications   Medication Sig Start Date End Date Taking? Authorizing Provider  albuterol (PROVENTIL HFA) 108 (90 Base) MCG/ACT inhaler Inhale into the lungs. 01/30/17  Yes [provider]  amLODipine (NORVASC) 5 MG tablet Take 1 tablet (5 mg total) by mouth daily. 01/25/16  Yes Cook, Jayce G, DO  carvedilol (COREG) 25 MG tablet Take 25 mg by mouth 2 (two) times daily. 03/11/17  Yes [provider]  esomeprazole (NEXIUM) 40 MG capsule Take 40 mg by mouth daily at 12 noon.   Yes [provider]  fluticasone (FLONASE) 50 MCG/ACT nasal spray Place into both nostrils daily.   Yes [provider]  glipiZIDE (GLUCOTROL XL) 2.5 MG 24 hr tablet Take by mouth. 03/08/17 03/08/18 Yes [provider]  hydrochlorothiazide (MICROZIDE) 12.5 MG capsule Take 12.5 mg by mouth daily.   Yes [provider]  Lancets MISC by Does not apply route.   Yes [provider]  levothyroxine (SYNTHROID, LEVOTHROID) 25 MCG tablet  Take 25 mcg by mouth daily before breakfast.   Yes [provider]  lisinopril (PRINIVIL,ZESTRIL) 10 MG tablet Take 20 mg by mouth daily. 03/11/17  Yes [provider]  metFORMIN (GLUCOPHAGE) 1000 MG tablet Take 1,000 mg by mouth 2 (two) times daily with a meal.   Yes [provider]  Multiple Vitamins-Minerals (PRESERVISION AREDS 2 PO) Take by mouth.   Yes [provider]  pravastatin (PRAVACHOL) 40 MG tablet Take 40 mg by mouth daily. 03/11/17  Yes [provider]  cephALEXin (KEFLEX) 500 MG capsule Take 1 capsule (500 mg total) by mouth 2 (two) times daily. 09/11/17   Lorin Picket, PA-C  phenazopyridine (PYRIDIUM) 100 MG tablet Take 1 tablet (100 mg total) by mouth 3 (three) times daily. 09/11/17   Lorin Picket, PA-C    Family History Family History  Problem Relation Age of Onset  . Cancer Father     Social History Social History   Tobacco Use  . Smoking status: Never Smoker  . Smokeless tobacco: Never Used  Substance Use Topics  . Alcohol use: No  . Drug use: Never     Allergies   Patient has no known allergies.   Review of Systems Review of Systems  Constitutional: Positive for activity change. Negative for appetite change, chills, fatigue and fever.  Genitourinary: Positive for dysuria,  frequency and urgency. Negative for vaginal discharge and vaginal pain.  All other systems reviewed and are negative.    Physical Exam Triage Vital Signs ED Triage Vitals  Enc Vitals Group     BP 09/11/17 1217 (!) 168/63     Pulse Rate 09/11/17 1217 (!) 58     Resp 09/11/17 1217 16     Temp 09/11/17 1217 97.7 F (36.5 C)     Temp Source 09/11/17 1217 Oral     SpO2 09/11/17 1217 100 %     Weight 09/11/17 1215 170 lb (77.1 kg)     Height 09/11/17 1215 5\' 5"  (1.651 m)     Head Circumference --      Peak Flow --      Pain Score 09/11/17 1215 9     Pain Loc --      Pain Edu? --      Excl. in [place]? --    No data  found.  Updated Vital Signs BP (!) 168/63 (BP Location: Left Arm)   Pulse (!) 58   Temp 97.7 F (36.5 C) (Oral)   Resp 16   Ht 5\' 5"  (1.651 m)   Wt 170 lb (77.1 kg)   SpO2 100%   BMI 28.29 kg/m   Visual Acuity Right Eye Distance:   Left Eye Distance:   Bilateral Distance:    Right Eye Near:   Left Eye Near:    Bilateral Near:     Physical Exam  Constitutional: She is oriented to person, place, and time. She appears well-developed and well-nourished. No distress.  HENT:  Head: Normocephalic.  Eyes: Pupils are equal, round, and reactive to light. Right eye exhibits no discharge. Left eye exhibits no discharge.  Neck: Normal range of motion.  Pulmonary/Chest: Effort normal and breath sounds normal.  Abdominal: Soft. Bowel sounds are normal. She exhibits no distension. There is tenderness. There is no rebound and no guarding.  Patient has suprapubic pain. No  CVA tenderness elicited  Musculoskeletal: Normal range of motion.  Neurological: She is alert and oriented to person, place, and time.  Skin: Skin is warm and dry. She is not diaphoretic.  Psychiatric: She has a normal mood and affect. Her behavior is normal. Judgment and thought content normal.  Nursing note and vitals reviewed.    UC Treatments / Results  Labs (all labs ordered are listed, but only abnormal results are displayed) Labs Reviewed  URINALYSIS, COMPLETE (UACMP) WITH MICROSCOPIC - Abnormal; Notable for the following components:      Result Value   APPearance CLOUDY (*)    Hgb urine dipstick LARGE (*)    Protein, ur >300 (*)    Leukocytes, UA MODERATE (*)    Bacteria, UA MANY (*)    All other components within normal limits  URINE CULTURE    EKG None  Radiology No results found.  Procedures Procedures (including critical care time)  Medications Ordered in UC Medications - No data to display  Initial Impression / Assessment and Plan / UC Course  I have reviewed the triage vital signs and  the nursing notes.  Pertinent labs & imaging results that were available during my care of the patient were reviewed by me and considered in my medical decision making (see chart for details).     Plan: 1. Test/x-ray results and diagnosis reviewed with patient 2. rx as per orders; risks, benefits, potential side effects reviewed with patient 3. Recommend supportive treatment with drinking of fluids.  Culture her urine and start her on Keflex and Pyridium.  Cultures will be available in 48 hours.  Daughter was instructed that if Mother runs high fevers has nausea or vomiting chills she should go to the emergency room. 4. F/u prn if symptoms worsen or don't improve  Final Clinical Impressions(s) / UC Diagnoses   Final diagnoses:  Dysuria   Discharge Instructions   None    ED Prescriptions    Medication Sig Dispense Auth. Provider   cephALEXin (KEFLEX) 500 MG capsule Take 1 capsule (500 mg total) by mouth 2 (two) times daily. 10 capsule Lorin Picket, PA-C   phenazopyridine (PYRIDIUM) 100 MG tablet Take 1 tablet (100 mg total) by mouth 3 (three) times daily. 6 tablet Lorin Picket, PA-C     Controlled Substance Prescriptions [place] Controlled Substance Registry consulted? Not Applicable   Lorin Picket, PA-C 09/11/17 1319

## 2017-09-11 NOTE — ED Triage Notes (Signed)
Patient c/o dysuria and low back pain that started 3 days ago.

## 2017-09-14 ENCOUNTER — Telehealth (HOSPITAL_COMMUNITY): Payer: Self-pay

## 2017-09-14 LAB — URINE CULTURE: Culture: >=100000 — AB

## 2017-09-14 NOTE — Telephone Encounter (Signed)
Urine culture positive for E.coli. This was treated with Keflex at ucc visit. Pt called and made aware.

## 2017-11-20 DIAGNOSIS — E785 Hyperlipidemia, unspecified: Secondary | ICD-10-CM | POA: Insufficient documentation

## 2018-03-19 DIAGNOSIS — M8589 Other specified disorders of bone density and structure, multiple sites: Secondary | ICD-10-CM | POA: Insufficient documentation

## 2018-07-16 DIAGNOSIS — D3A00 Benign carcinoid tumor of unspecified site: Secondary | ICD-10-CM | POA: Insufficient documentation

## 2018-07-16 DIAGNOSIS — J45909 Unspecified asthma, uncomplicated: Secondary | ICD-10-CM | POA: Insufficient documentation

## 2018-08-05 ENCOUNTER — Encounter: Payer: Self-pay | Admitting: Podiatry

## 2018-08-05 ENCOUNTER — Other Ambulatory Visit: Payer: Self-pay

## 2018-08-05 ENCOUNTER — Ambulatory Visit: Payer: Medicare HMO | Admitting: Podiatry

## 2018-08-05 DIAGNOSIS — M79674 Pain in right toe(s): Secondary | ICD-10-CM | POA: Diagnosis not present

## 2018-08-05 DIAGNOSIS — B351 Tinea unguium: Secondary | ICD-10-CM

## 2018-08-05 DIAGNOSIS — M79675 Pain in left toe(s): Secondary | ICD-10-CM | POA: Diagnosis not present

## 2018-08-05 DIAGNOSIS — E119 Type 2 diabetes mellitus without complications: Secondary | ICD-10-CM | POA: Diagnosis not present

## 2018-08-05 NOTE — Progress Notes (Addendum)
This patient presents to the office with chief complaint of Dobkins thick nails and diabetic feet.  This patient  says there  is  no pain and discomfort in her  feet.  This patient says there are Caillier thick painful nails.  These nails are painful walking and wearing shoes.  Patient has no history of infection or drainage from both feet.  Patient is unable to  self treat his own nails . This patient presents  to the office today for treatment of the  Clarey nails and a foot evaluation due to history of  Diabetes.  Patient presents to the office with her daughter.  Daughter has lost her husband.  General Appearance  Alert, conversant and in no acute stress.  Vascular  Dorsalis pedis and posterior tibial  pulses are palpable  bilaterally.  Capillary return is within normal limits  bilaterally. Temperature is within normal limits  bilaterally.  Neurologic  Senn-Weinstein monofilament wire test within normal limits  bilaterally. Muscle power within normal limits bilaterally.  Nails Thick disfigured discolored nails with subungual debris  from hallux to fifth toes bilaterally. No evidence of bacterial infection or drainage bilaterally.  Orthopedic  No limitations of motion of motion feet .  No crepitus or effusions noted.  HAV  right.  Skin  normotropic skin with no porokeratosis noted bilaterally.  No signs of infections or ulcers noted.     Onychomycosis  Diabetes with no foot complications  IE  Debride nails x 10.  A diabetic foot exam was performed and there is no evidence of any vascular or neurologic pathology.   RTC 3 months.   Gardiner Barefoot DPM

## 2018-10-12 DIAGNOSIS — F0391 Unspecified dementia with behavioral disturbance: Secondary | ICD-10-CM | POA: Insufficient documentation

## 2018-11-04 ENCOUNTER — Other Ambulatory Visit: Payer: Self-pay

## 2018-11-04 ENCOUNTER — Encounter: Payer: Self-pay | Admitting: Podiatry

## 2018-11-04 ENCOUNTER — Ambulatory Visit (INDEPENDENT_AMBULATORY_CARE_PROVIDER_SITE_OTHER): Payer: Medicare HMO | Admitting: Podiatry

## 2018-11-04 DIAGNOSIS — B351 Tinea unguium: Secondary | ICD-10-CM

## 2018-11-04 DIAGNOSIS — M79675 Pain in left toe(s): Secondary | ICD-10-CM

## 2018-11-04 DIAGNOSIS — E119 Type 2 diabetes mellitus without complications: Secondary | ICD-10-CM

## 2018-11-04 DIAGNOSIS — M79674 Pain in right toe(s): Secondary | ICD-10-CM | POA: Diagnosis not present

## 2018-11-04 NOTE — Progress Notes (Signed)
Complaint:  Visit Type: Patient returns to my office for continued preventative foot care services. Complaint: Patient states" my nails have grown Nauman and thick and become painful to walk and wear shoes" Patient has been diagnosed with DM with no foot complications. The patient presents for preventative foot care services.  Podiatric Exam: Vascular: dorsalis pedis and posterior tibial pulses are palpable bilateral. Capillary return is immediate. Temperature gradient is WNL. Skin turgor WNL  Sensorium: Normal Semmes Weinstein monofilament test. Normal tactile sensation bilaterally. Nail Exam: Pt has thick disfigured discolored nails with subungual debris noted bilateral entire nail hallux through fifth toenails Ulcer Exam: There is no evidence of ulcer or pre-ulcerative changes or infection. Orthopedic Exam: Muscle tone and strength are WNL. No limitations in general ROM. No crepitus or effusions noted. Foot type and digits show no abnormalities. Bony prominences are unremarkable. Skin: No Porokeratosis. No infection or ulcers  Diagnosis:  Onychomycosis, , Pain in right toe, pain in left toes  Treatment & Plan Procedures and Treatment: Consent by patient was obtained for treatment procedures.   Debridement of mycotic and hypertrophic toenails, 1 through 5 bilateral and clearing of subungual debris. No ulceration, no infection noted.  Return Visit-Office Procedure: Patient instructed to return to the office for a follow up visit 3 months for continued evaluation and treatment.    Joury Allcorn DPM 

## 2018-12-07 ENCOUNTER — Ambulatory Visit (INDEPENDENT_AMBULATORY_CARE_PROVIDER_SITE_OTHER): Payer: Medicare HMO

## 2018-12-07 ENCOUNTER — Ambulatory Visit
Admission: EM | Admit: 2018-12-07 | Discharge: 2018-12-07 | Disposition: A | Discharge status: Home / Self Care | Payer: Medicare HMO | Attending: Family Medicine | Admitting: Family Medicine

## 2018-12-07 ENCOUNTER — Other Ambulatory Visit: Payer: Self-pay

## 2018-12-07 DIAGNOSIS — R05 Cough: Secondary | ICD-10-CM

## 2018-12-07 DIAGNOSIS — J029 Acute pharyngitis, unspecified: Secondary | ICD-10-CM

## 2018-12-07 DIAGNOSIS — J019 Acute sinusitis, unspecified: Secondary | ICD-10-CM

## 2018-12-07 DIAGNOSIS — R519 Headache, unspecified: Secondary | ICD-10-CM | POA: Diagnosis not present

## 2018-12-07 LAB — RAPID STREP SCREEN (MED CTR MEBANE ONLY): Streptococcus, Group A Screen (Direct): NEGATIVE

## 2018-12-07 LAB — RAPID INFLUENZA A&B ANTIGENS: Influenza A (ARMC): NEGATIVE

## 2018-12-07 LAB — RAPID INFLUENZA A&B ANTIGENS (ARMC ONLY): Influenza B (ARMC): NEGATIVE

## 2018-12-07 MED ORDER — DOXYCYCLINE HYCLATE 100 MG PO CAPS: 100.0000 mg | ORAL_CAPSULE | Freq: Two times a day (BID) | ORAL | 0 refills | Status: DC

## 2018-12-07 NOTE — ED Provider Notes (Signed)
[facility]    CSN: [id number] Arrival date & time: 12/07/18  1410  History   Chief Complaint Cough  HPI  85 year old female presents for evaluation of respiratory symptoms.  Patient is accompanied by her daughter today who helps with history.  1 week history of sore throat, congestion, sinus pain/pressure, and associated headache.  Also productive cough.  No documented fever.  No reported sick contacts.  Has been using over-the-counter Tylenol without resolution.  Was previously treated with azithromycin for a productive cough on 10/23.  No known exacerbating factors.  No other associated symptoms.  No other complaints.  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   Diabetic retinopathy (CMS-HCC)  mild retinopathy  Macular degeneration  Ex Macular degeneration OU w GA; avastin right eye  Diabetes mellitus (CMS-HCC) Dx 1960 Type II   Surgical Hx: HYSTERECTOMY      TUBAL LIGATION      BREAST BIOPSY  Right many yrs ago   CATARACT EXTRACTION around 2012 Bilateral    EYE SURGERY      PR COLSC FLX W/RMVL OF TUMOR POLYP LESION SNARE TQ 09/21/2015 N/A Procedure: COLONOSCOPY FLEX; W/REMOV TUMOR/LES BY SNARE; Surgeon: Varney Baas, MD; [facility]; Service: Gastroenterology   PR EDG TRANSORAL ENDOSCOPIC MUCOSAL RESECTION 09/21/2015  Procedure: ESOPHAGOGASTRODUODENOSCOPY, FLEXIBLE, TRANSORAL; WITH ENDOSCOPIC MUCOSAL RESECTION; Surgeon: Varney Baas, MD; [facility]; Service: Gastroenterology   PR EDG TRANSORAL ENDOSCOPIC MUCOSAL RESECTION 01/29/2016 N/A Procedure: ESOPHAGOGASTRODUODENOSCOPY, FLEXIBLE, TRANSORAL; WITH  ENDOSCOPIC MUCOSAL RESECTION; Surgeon: Varney Baas, MD; Location[facility]; Service: Gastroenterology       Home Medications    Prior to Admission medications   Medication Sig Start Date End Date Taking? Authorizing Provider  albuterol (VENTOLIN HFA) 108 (90 Base) MCG/ACT inhaler Inhale into the lungs. 02/26/18 07/16/19 Yes [provider]  Alcohol Swabs (B-D SINGLE USE SWABS REGULAR) PADS To be used for checking glucose twice daily 07/22/18  Yes [provider]  amLODipine (NORVASC) 10 MG tablet Take by mouth. 07/08/18 01/04/19 Yes [provider]  Assure Comfort Lancets 28G [place]  06/08/14  Yes [provider]  carvedilol (COREG) 25 MG tablet Take by mouth. 07/08/18 01/04/19 Yes [provider]  donepezil (ARICEPT) 5 MG tablet Take by mouth. 10/12/18 10/12/19 Yes [provider]  esomeprazole (NEXIUM) 40 MG capsule Take by mouth. 03/21/13 07/08/19 Yes [provider]  fluticasone (FLONASE) 50 MCG/ACT nasal spray Place into both nostrils daily.   Yes [provider]  fluticasone-salmeterol (ADVAIR HFA) 115-21 MCG/ACT inhaler 1 puff twice daily 07/16/18  Yes [provider]  glipiZIDE (GLUCOTROL XL) 5 MG 24 hr tablet Take by mouth. 03/29/18 03/29/19 Yes [provider]  glucose blood (PRECISION QID TEST) test strip To be used to check glucose twice daily. E11.9 02/26/18  Yes [provider]  hydrochlorothiazide (MICROZIDE) 12.5 MG capsule Take 12.5 mg by mouth daily.   Yes [provider]  levothyroxine (SYNTHROID) 25 MCG tablet Take by mouth. 05/10/14 01/04/19 Yes [provider]  lisinopril (PRINIVIL,ZESTRIL) 10 MG tablet Take 20 mg by mouth daily. 03/11/17  Yes [provider]  loratadine (CLARITIN) 10 MG  tablet Take by mouth. 04/30/18 04/30/19 Yes [provider]  losartan (COZAAR) 25 MG tablet Take 12.5 mg by mouth daily. 08/03/18  Yes [provider]  metFORMIN (GLUCOPHAGE) 500 MG tablet Take 500 mg by mouth daily. 08/17/18  Yes [provider]  pravastatin (PRAVACHOL) 40 MG tablet Take by mouth. 07/08/18 07/08/19 Yes [provider]  doxycycline (VIBRAMYCIN) 100 MG capsule Take 1 capsule (100 mg total) by mouth 2 (two) times daily. 12/07/18   Coral Spikes, DO   Family History Family History  Problem Relation Age of Onset  . Cancer Father    Social History Social History   Tobacco Use  . Smoking status: Never Smoker  . Smokeless tobacco: Never Used  Substance Use Topics  . Alcohol use: No  . Drug use: Never    Allergies   Patient has no known allergies.   Review of Systems Review of Systems  Constitutional: Negative for fever.  HENT: Positive for congestion, sinus pressure, sinus pain and sore throat.   Respiratory: Positive for cough.   Neurological: Positive for headaches.   Physical Exam Triage Vital Signs ED Triage Vitals  Enc Vitals Group     BP 12/07/18 1451 (!) 157/69     Pulse Rate 12/07/18 1451 70     Resp 12/07/18 1451 16     Temp 12/07/18 1451 98 F (36.7 C)     Temp Source 12/07/18 1451 Oral     SpO2 12/07/18 1451 97 %     Weight 12/07/18 1445 173 lb (78.5 kg)     Height 12/07/18 1445 5\' 5"  (1.651 m)     Head Circumference --      Peak Flow --      Pain Score 12/07/18 1444 10     Pain Loc --      Pain Edu? --      Excl. in [place]? --    Updated Vital Signs BP (!) 157/69 (BP Location: Left Arm)   Pulse 70   Temp 98 F (36.7 C) (Oral)   Resp 16   Ht 5\' 5"  (1.651 m)   Wt 78.5 kg   SpO2 97%   BMI 28.79 kg/m   Visual Acuity Right Eye Distance:   Left Eye Distance:   Bilateral Distance:    Right Eye Near:   Left Eye Near:    Bilateral Near:     Physical Exam Vitals signs and nursing note reviewed.  Constitutional:      General: She is not in acute distress.    Appearance: Normal appearance. She is not ill-appearing.  HENT:     Head: Normocephalic and  atraumatic.     Right Ear: Tympanic membrane normal.     Left Ear: Tympanic membrane normal.     Mouth/Throat:     Pharynx: Oropharynx is clear. No oropharyngeal exudate or posterior oropharyngeal erythema.  Eyes:     General:        Right eye: No discharge.        Left eye: No discharge.     Conjunctiva/sclera: Conjunctivae normal.  Cardiovascular:     Rate and Rhythm: Normal rate and regular rhythm.  Pulmonary:     Effort: Pulmonary effort is normal.     Breath sounds: Wheezing present. No rhonchi or rales.  Neurological:     Mental Status: She is alert.  Psychiatric:        Mood and Affect: Mood normal.  Behavior: Behavior normal.    UC Treatments / Results  Labs (all labs ordered are listed, but only abnormal results are displayed) Labs Reviewed  RAPID INFLUENZA A&B ANTIGENS ([facility] ONLY)  RAPID STREP SCREEN ([facility] ONLY)  NOVEL CORONAVIRUS, NAA (HOSP ORDER, SEND-OUT TO REF LAB; TAT 18-24 HRS)  CULTURE, GROUP A STREP [facility])    EKG   Radiology Dg Chest 2 View  Result Date: 12/07/2018 CLINICAL DATA:  Cough and short of breath EXAM: CHEST - 2 VIEW COMPARISON:  05/29/2017 FINDINGS: No acute consolidation or pleural effusion. Calcified nodule left apex no change. Rounded opacity in the left perihilar region, no change, either representing calcified nodule or vessel on end. Stable cardiomediastinal silhouette with aortic atherosclerosis. No pneumothorax. IMPRESSION: No active cardiopulmonary disease. Electronically Signed   By: Donavan Foil M.D.   On: 12/07/2018 15:51    Procedures Procedures (including critical care time)  Medications Ordered in UC Medications - No data to display  Initial Impression / Assessment and Plan / UC Course  I have reviewed the triage vital signs and the nursing notes.  Pertinent labs & imaging results that were available during my care of the patient were reviewed by me and considered in my medical decision making (see chart  for details).    85 year old female presents with respiratory symptoms.  Suspect sinusitis.  Chest x-ray negative.  Doxycycline as prescribed.  Final Clinical Impressions(s) / UC Diagnoses   Final diagnoses:  Acute sinusitis, recurrence not specified, unspecified location   Discharge Instructions   None    ED Prescriptions    Medication Sig Dispense Auth. Provider   doxycycline (VIBRAMYCIN) 100 MG capsule Take 1 capsule (100 mg total) by mouth 2 (two) times daily. 14 capsule Thersa Salt G, DO     PDMP not reviewed this encounter.   Coral Spikes, [place] 12/07/18 1643

## 2018-12-07 NOTE — ED Triage Notes (Signed)
Patient states she has a sore throat that has been going on for about a week, she has been also experiencing congestion, and a headaches.  OTC: Tylenol

## 2018-12-08 LAB — NOVEL CORONAVIRUS, NAA (HOSP ORDER, SEND-OUT TO REF LAB; TAT 18-24 HRS): SARS-CoV-2, NAA: NOT DETECTED

## 2018-12-10 LAB — CULTURE, GROUP A STREP (THRC)

## 2019-02-28 ENCOUNTER — Other Ambulatory Visit: Payer: Self-pay

## 2019-02-28 ENCOUNTER — Ambulatory Visit: Payer: Medicare HMO | Admitting: Podiatry

## 2019-02-28 ENCOUNTER — Encounter: Payer: Self-pay | Admitting: Podiatry

## 2019-02-28 DIAGNOSIS — B351 Tinea unguium: Secondary | ICD-10-CM

## 2019-02-28 DIAGNOSIS — E119 Type 2 diabetes mellitus without complications: Secondary | ICD-10-CM | POA: Diagnosis not present

## 2019-02-28 DIAGNOSIS — M79674 Pain in right toe(s): Secondary | ICD-10-CM

## 2019-02-28 DIAGNOSIS — M79675 Pain in left toe(s): Secondary | ICD-10-CM | POA: Diagnosis not present

## 2019-02-28 NOTE — Progress Notes (Signed)
Complaint:  Visit Type: Patient returns to my office for continued preventative foot care services. Complaint: Patient states" my nails have grown [place] and thick and become painful to walk and wear shoes" Patient has been diagnosed with DM with no foot complications. The patient presents for preventative foot care services.  Podiatric Exam: Vascular: dorsalis pedis and posterior tibial pulses are palpable bilateral. Capillary return is immediate. Temperature gradient is WNL. Skin turgor WNL  Sensorium: Normal Semmes Weinstein monofilament test. Normal tactile sensation bilaterally. Nail Exam: Pt has thick disfigured discolored nails with subungual debris noted bilateral entire nail hallux through fifth toenails Ulcer Exam: There is no evidence of ulcer or pre-ulcerative changes or infection. Orthopedic Exam: Muscle tone and strength are WNL. No limitations in general ROM. No crepitus or effusions noted. Foot type and digits show no abnormalities. Bony prominences are unremarkable. Skin: No Porokeratosis. No infection or ulcers  Diagnosis:  Onychomycosis, , Pain in right toe, pain in left toes  Treatment & Plan Procedures and Treatment: Consent by patient was obtained for treatment procedures.   Debridement of mycotic and hypertrophic toenails, 1 through 5 bilateral and clearing of subungual debris. No ulceration, no infection noted.  Return Visit-Office Procedure: Patient instructed to return to the office for a follow up visit 3 months for continued evaluation and treatment.    Salli Bodin DPM 

## 2019-04-25 DIAGNOSIS — Z862 Personal history of diseases of the blood and blood-forming organs and certain disorders involving the immune mechanism: Secondary | ICD-10-CM | POA: Insufficient documentation

## 2019-05-30 ENCOUNTER — Ambulatory Visit: Payer: Medicare HMO | Admitting: Podiatry

## 2019-05-30 ENCOUNTER — Other Ambulatory Visit: Payer: Self-pay

## 2019-05-30 ENCOUNTER — Encounter: Payer: Self-pay | Admitting: Podiatry

## 2019-05-30 DIAGNOSIS — M79675 Pain in left toe(s): Secondary | ICD-10-CM | POA: Diagnosis not present

## 2019-05-30 DIAGNOSIS — E119 Type 2 diabetes mellitus without complications: Secondary | ICD-10-CM | POA: Diagnosis not present

## 2019-05-30 DIAGNOSIS — M79674 Pain in right toe(s): Secondary | ICD-10-CM

## 2019-05-30 DIAGNOSIS — B351 Tinea unguium: Secondary | ICD-10-CM

## 2019-05-30 NOTE — Progress Notes (Signed)
This patient returns to my office for at risk foot care.  This patient requires this care by a professional since this patient will be at risk due to having diabetes.  This patient is unable to cut nails herself since the patient cannot reach her nails.These nails are painful walking and wearing shoes.  This patient presents for at risk foot care today.  She presents to the office with daughter.  General Appearance  Alert, conversant and in no acute stress.  Vascular  Dorsalis pedis and posterior tibial  pulses are palpable  bilaterally.  Capillary return is within normal limits  bilaterally. Temperature is within normal limits  bilaterally.  Neurologic  Senn-Weinstein monofilament wire test within normal limits  bilaterally. Muscle power within normal limits bilaterally.  Nails Thick disfigured discolored nails with subungual debris  from hallux to fifth toes bilaterally. No evidence of bacterial infection or drainage bilaterally.  Orthopedic  No limitations of motion  feet .  No crepitus or effusions noted.  No bony pathology or digital deformities noted.  Skin  normotropic skin with no porokeratosis noted bilaterally.  No signs of infections or ulcers noted.     Onychomycosis  Pain in right toes  Pain in left toes  Consent was obtained for treatment procedures.   Mechanical debridement of nails 1-5  bilaterally performed with a nail nipper.  Filed with dremel without incident.    Return office visit    3 months                  Told patient to return for periodic foot care and evaluation due to potential at risk complications.   Gardiner Barefoot DPM

## 2019-09-05 ENCOUNTER — Encounter: Payer: Self-pay | Admitting: Podiatry

## 2019-09-05 ENCOUNTER — Other Ambulatory Visit: Payer: Self-pay

## 2019-09-05 ENCOUNTER — Ambulatory Visit: Payer: Medicare HMO | Admitting: Podiatry

## 2019-09-05 DIAGNOSIS — M79675 Pain in left toe(s): Secondary | ICD-10-CM

## 2019-09-05 DIAGNOSIS — M79674 Pain in right toe(s): Secondary | ICD-10-CM | POA: Diagnosis not present

## 2019-09-05 DIAGNOSIS — B351 Tinea unguium: Secondary | ICD-10-CM

## 2019-09-05 DIAGNOSIS — E119 Type 2 diabetes mellitus without complications: Secondary | ICD-10-CM

## 2019-09-05 NOTE — Progress Notes (Signed)
This patient returns to my office for at risk foot care.  This patient requires this care by a professional since this patient will be at risk due to having diabetes.  This patient is unable to cut nails herself since the patient cannot reach her nails.These nails are painful walking and wearing shoes.  This patient presents for at risk foot care today.  She presents to the office with daughter.  General Appearance  Alert, conversant and in no acute stress.  Vascular  Dorsalis pedis and posterior tibial  pulses are palpable  bilaterally.  Capillary return is within normal limits  bilaterally. Temperature is within normal limits  bilaterally.  Neurologic  Senn-Weinstein monofilament wire test within normal limits  bilaterally. Muscle power within normal limits bilaterally.  Nails Thick disfigured discolored nails with subungual debris  from hallux to fifth toes bilaterally. No evidence of bacterial infection or drainage bilaterally.  Orthopedic  No limitations of motion  feet .  No crepitus or effusions noted.  No bony pathology or digital deformities noted.  Skin  normotropic skin with no porokeratosis noted bilaterally.  No signs of infections or ulcers noted.     Onychomycosis  Pain in right toes  Pain in left toes  Consent was obtained for treatment procedures.   Mechanical debridement of nails 1-5  bilaterally performed with a nail nipper.  Filed with dremel without incident.    Return office visit    3 months                  Told patient to return for periodic foot care and evaluation due to potential at risk complications.   Gardiner Barefoot DPM

## 2019-09-19 DIAGNOSIS — G459 Transient cerebral ischemic attack, unspecified: Secondary | ICD-10-CM | POA: Insufficient documentation

## 2019-09-29 ENCOUNTER — Other Ambulatory Visit: Payer: Self-pay

## 2019-09-29 ENCOUNTER — Emergency Department
Admission: EM | Admit: 2019-09-29 | Discharge: 2019-09-29 | Disposition: A | Discharge status: Home / Self Care | Payer: Medicare HMO | Attending: Emergency Medicine | Admitting: Emergency Medicine

## 2019-09-29 ENCOUNTER — Emergency Department: Payer: Medicare HMO

## 2019-09-29 DIAGNOSIS — Y92003 Bedroom of unspecified non-institutional (private) residence as the place of occurrence of the external cause: Secondary | ICD-10-CM | POA: Insufficient documentation

## 2019-09-29 DIAGNOSIS — S0093XA Contusion of unspecified part of head, initial encounter: Secondary | ICD-10-CM | POA: Insufficient documentation

## 2019-09-29 DIAGNOSIS — Y939 Activity, unspecified: Secondary | ICD-10-CM | POA: Insufficient documentation

## 2019-09-29 DIAGNOSIS — Z5321 Procedure and treatment not carried out due to patient leaving prior to being seen by health care provider: Secondary | ICD-10-CM | POA: Insufficient documentation

## 2019-09-29 DIAGNOSIS — Y999 Unspecified external cause status: Secondary | ICD-10-CM | POA: Insufficient documentation

## 2019-09-29 DIAGNOSIS — W19XXXA Unspecified fall, initial encounter: Secondary | ICD-10-CM | POA: Insufficient documentation

## 2019-09-29 DIAGNOSIS — F0390 Unspecified dementia without behavioral disturbance: Secondary | ICD-10-CM | POA: Insufficient documentation

## 2019-09-29 LAB — BASIC METABOLIC PANEL
Anion gap: 9 (ref 5–15)
BUN: 38 mg/dL — ABNORMAL HIGH (ref 8–23)
CO2: 25 mmol/L (ref 22–32)
Calcium: 8.7 mg/dL — ABNORMAL LOW (ref 8.9–10.3)
Chloride: 102 mmol/L (ref 98–111)
Creatinine, Ser: 3.69 mg/dL — ABNORMAL HIGH (ref 0.44–1.00)
GFR calc Af Amer: 12 mL/min — ABNORMAL LOW (ref >60)
GFR calc non Af Amer: 10 mL/min — ABNORMAL LOW (ref >60)
Glucose, Bld: 89 mg/dL (ref 70–99)
Potassium: 4.5 mmol/L (ref 3.5–5.1)
Sodium: 136 mmol/L (ref 135–145)

## 2019-09-29 LAB — CBC
HCT: 30.3 % — ABNORMAL LOW (ref 36.0–46.0)
Hemoglobin: 9.9 g/dL — ABNORMAL LOW (ref 12.0–15.0)
MCH: 31.3 pg (ref 26.0–34.0)
MCHC: 32.7 g/dL (ref 30.0–36.0)
MCV: 95.9 fL (ref 80.0–100.0)
Platelets: 234 10*3/uL (ref 150–400)
RBC: 3.16 MIL/uL — ABNORMAL LOW (ref 3.87–5.11)
RDW: 12.7 % (ref 11.5–15.5)
WBC: 7.5 10*3/uL (ref 4.0–10.5)
nRBC: 0 % (ref 0.0–0.2)

## 2019-09-29 NOTE — ED Triage Notes (Addendum)
Pt comes into the ED via EMS from home with c/o unwitnessed fall in the [place] this morning with a hematoma to the left side of the head, not on any blood thinners. CBG 78.. denies any other pain other then a HA at present. Pt is unable to tell if she passed out or tripped, pt has a hx of dementia

## 2019-09-29 NOTE — ED Notes (Signed)
Pt daughter reported they were leaving to go to [facility]. Family had already spoke with someone on the phone who suggested they head there

## 2019-12-12 ENCOUNTER — Ambulatory Visit: Payer: Medicare HMO | Admitting: Podiatry

## 2019-12-12 ENCOUNTER — Ambulatory Visit (INDEPENDENT_AMBULATORY_CARE_PROVIDER_SITE_OTHER): Payer: Medicare HMO | Admitting: Podiatry

## 2019-12-12 ENCOUNTER — Encounter: Payer: Self-pay | Admitting: Podiatry

## 2019-12-12 ENCOUNTER — Other Ambulatory Visit: Payer: Self-pay

## 2019-12-12 DIAGNOSIS — M79675 Pain in left toe(s): Secondary | ICD-10-CM

## 2019-12-12 DIAGNOSIS — B351 Tinea unguium: Secondary | ICD-10-CM

## 2019-12-12 DIAGNOSIS — E119 Type 2 diabetes mellitus without complications: Secondary | ICD-10-CM

## 2019-12-12 DIAGNOSIS — M79674 Pain in right toe(s): Secondary | ICD-10-CM

## 2019-12-12 NOTE — Progress Notes (Signed)
This patient returns to my office for at risk foot care.  This patient requires this care by a professional since this patient will be at risk due to having diabetes.  This patient is unable to cut nails herself since the patient cannot reach her nails.These nails are painful walking and wearing shoes.  This patient presents for at risk foot care today.  She presents to the office with daughter.  General Appearance  Alert, conversant and in no acute stress.  Vascular  Dorsalis pedis and posterior tibial  pulses are palpable  bilaterally.  Capillary return is within normal limits  bilaterally. Temperature is within normal limits  bilaterally.  Neurologic  Senn-Weinstein monofilament wire test within normal limits  bilaterally. Muscle power within normal limits bilaterally.  Nails Thick disfigured discolored nails with subungual debris  from hallux to fifth toes bilaterally. No evidence of bacterial infection or drainage bilaterally.  Orthopedic  No limitations of motion  feet .  No crepitus or effusions noted.  No bony pathology or digital deformities noted.  Skin  normotropic skin with no porokeratosis noted bilaterally.  No signs of infections or ulcers noted.     Onychomycosis  Pain in right toes  Pain in left toes  Consent was obtained for treatment procedures.   Mechanical debridement of nails 1-5  bilaterally performed with a nail nipper.  Filed with dremel without incident.    Return office visit    10 weeks                  Told patient to return for periodic foot care and evaluation due to potential at risk complications.   Gardiner Barefoot DPM

## 2020-02-20 ENCOUNTER — Ambulatory Visit: Payer: Medicare HMO | Admitting: Podiatry

## 2020-02-20 ENCOUNTER — Encounter: Payer: Self-pay | Admitting: Podiatry

## 2020-02-20 ENCOUNTER — Other Ambulatory Visit: Payer: Self-pay

## 2020-02-20 DIAGNOSIS — M79674 Pain in right toe(s): Secondary | ICD-10-CM | POA: Diagnosis not present

## 2020-02-20 DIAGNOSIS — E119 Type 2 diabetes mellitus without complications: Secondary | ICD-10-CM

## 2020-02-20 DIAGNOSIS — M79675 Pain in left toe(s): Secondary | ICD-10-CM

## 2020-02-20 DIAGNOSIS — B351 Tinea unguium: Secondary | ICD-10-CM

## 2020-02-20 NOTE — Progress Notes (Signed)
This patient returns to my office for at risk foot care.  This patient requires this care by a professional since this patient will be at risk due to having diabetes.  This patient is unable to cut nails herself since the patient cannot reach her nails.These nails are painful walking and wearing shoes.  This patient presents for at risk foot care today.  She presents to the office with daughter.  General Appearance  Alert, conversant and in no acute stress.  Vascular  Dorsalis pedis and posterior tibial  pulses are weakly  palpable  bilaterally.  Capillary return is within normal limits  bilaterally. Absent pedal hair.  bilaterally.  Neurologic  Senn-Weinstein monofilament wire test within normal limits  bilaterally. Muscle power within normal limits bilaterally.  Nails Thick disfigured discolored nails with subungual debris  from hallux to fifth toes bilaterally. No evidence of bacterial infection or drainage bilaterally.  Orthopedic  No limitations of motion  feet .  No crepitus or effusions noted.  No bony pathology or digital deformities noted.  Skin  normotropic skin with no porokeratosis noted bilaterally.  No signs of infections or ulcers noted.     Onychomycosis  Pain in right toes  Pain in left toes  Consent was obtained for treatment procedures.   Mechanical debridement of nails 1-5  bilaterally performed with a nail nipper.  Filed with dremel without incident.    Return office visit    10 weeks                  Told patient to return for periodic foot care and evaluation due to potential at risk complications.   Gardiner Barefoot DPM

## 2020-03-20 DIAGNOSIS — F0390 Unspecified dementia without behavioral disturbance: Secondary | ICD-10-CM | POA: Insufficient documentation

## 2020-04-30 ENCOUNTER — Ambulatory Visit: Payer: Medicare HMO | Admitting: Podiatry

## 2020-04-30 ENCOUNTER — Other Ambulatory Visit: Payer: Self-pay

## 2020-04-30 ENCOUNTER — Encounter: Payer: Self-pay | Admitting: Podiatry

## 2020-04-30 DIAGNOSIS — B351 Tinea unguium: Secondary | ICD-10-CM | POA: Diagnosis not present

## 2020-04-30 DIAGNOSIS — M79674 Pain in right toe(s): Secondary | ICD-10-CM | POA: Diagnosis not present

## 2020-04-30 DIAGNOSIS — M79675 Pain in left toe(s): Secondary | ICD-10-CM

## 2020-04-30 DIAGNOSIS — E119 Type 2 diabetes mellitus without complications: Secondary | ICD-10-CM

## 2020-04-30 NOTE — Progress Notes (Addendum)
This patient returns to my office for at risk foot care.  This patient requires this care by a professional since this patient will be at risk due to having diabetes.  This patient is unable to cut nails herself since the patient cannot reach her nails.These nails are painful walking and wearing shoes.  This patient presents for at risk foot care today.  She presents to the office with daughter.  General Appearance  Alert, conversant and in no acute stress.  Vascular  Dorsalis pedis and posterior tibial  pulses are weakly  palpable  bilaterally.  Capillary return is within normal limits  bilaterally. Absent pedal hair.  bilaterally.  Neurologic  Senn-Weinstein monofilament wire test within normal limits  bilaterally. Muscle power within normal limits bilaterally.  Nails Thick disfigured discolored nails with subungual debris  from hallux to fifth toes bilaterally. No evidence of bacterial infection or drainage bilaterally.  Orthopedic  No limitations of motion  feet .  No crepitus or effusions noted.  No bony pathology or digital deformities noted. Contracted fifth toe left foot.    Skin  normotropic skin with no porokeratosis noted bilaterally.  No signs of infections or ulcers noted.     Onychomycosis  Pain in right toes  Pain in left toes  Consent was obtained for treatment procedures.   Mechanical debridement of nails 1-5  bilaterally performed with a nail nipper.  Filed with dremel without incident. Padding dispensed to separate 4/5 toes left foot.   Return office visit    10 weeks                  Told patient to return for periodic foot care and evaluation due to potential at risk complications.   Gardiner Barefoot DPM

## 2020-07-09 ENCOUNTER — Encounter: Payer: Self-pay | Admitting: Podiatry

## 2020-07-09 ENCOUNTER — Ambulatory Visit: Payer: Medicare HMO | Admitting: Podiatry

## 2020-07-09 ENCOUNTER — Other Ambulatory Visit: Payer: Self-pay

## 2020-07-09 DIAGNOSIS — B351 Tinea unguium: Secondary | ICD-10-CM

## 2020-07-09 DIAGNOSIS — M79674 Pain in right toe(s): Secondary | ICD-10-CM

## 2020-07-09 DIAGNOSIS — M79675 Pain in left toe(s): Secondary | ICD-10-CM

## 2020-07-09 DIAGNOSIS — E119 Type 2 diabetes mellitus without complications: Secondary | ICD-10-CM

## 2020-07-09 NOTE — Progress Notes (Signed)
This patient returns to my office for at risk foot care.  This patient requires this care by a professional since this patient will be at risk due to having diabetes.  This patient is unable to cut nails herself since the patient cannot reach her nails.These nails are painful walking and wearing shoes.  This patient presents for at risk foot care today.  She presents to the office with daughter.  General Appearance  Alert, conversant and in no acute stress.  Vascular  Dorsalis pedis and posterior tibial  pulses are weakly  palpable  bilaterally.  Capillary return is within normal limits  bilaterally. Absent pedal hair.  bilaterally.  Neurologic  Senn-Weinstein monofilament wire test within normal limits  bilaterally. Muscle power within normal limits bilaterally.  Nails Thick disfigured discolored nails with subungual debris  from hallux to fifth toes bilaterally. No evidence of bacterial infection or drainage bilaterally.  Orthopedic  No limitations of motion  feet .  No crepitus or effusions noted.  No bony pathology or digital deformities noted. Contracted fifth toe left foot.    Skin  normotropic skin with no porokeratosis noted bilaterally.  No signs of infections or ulcers noted.     Onychomycosis  Pain in right toes  Pain in left toes  Consent was obtained for treatment procedures.   Mechanical debridement of nails 1-5  bilaterally performed with a nail nipper.  Filed with dremel without incident. Padding dispensed to separate 4/5 toes left foot.   Return office visit    10 weeks                  Told patient to return for periodic foot care and evaluation due to potential at risk complications.   Gardiner Barefoot DPM

## 2020-09-17 ENCOUNTER — Other Ambulatory Visit: Payer: Self-pay

## 2020-09-17 ENCOUNTER — Encounter: Payer: Self-pay | Admitting: Podiatry

## 2020-09-17 ENCOUNTER — Ambulatory Visit (INDEPENDENT_AMBULATORY_CARE_PROVIDER_SITE_OTHER): Payer: Medicare HMO | Admitting: Podiatry

## 2020-09-17 DIAGNOSIS — M79675 Pain in left toe(s): Secondary | ICD-10-CM | POA: Diagnosis not present

## 2020-09-17 DIAGNOSIS — B351 Tinea unguium: Secondary | ICD-10-CM

## 2020-09-17 DIAGNOSIS — N189 Chronic kidney disease, unspecified: Secondary | ICD-10-CM | POA: Diagnosis not present

## 2020-09-17 DIAGNOSIS — M79674 Pain in right toe(s): Secondary | ICD-10-CM

## 2020-09-17 DIAGNOSIS — E119 Type 2 diabetes mellitus without complications: Secondary | ICD-10-CM

## 2020-09-17 NOTE — Progress Notes (Signed)
This patient returns to my office for at risk foot care.  This patient requires this care by a professional since this patient will be at risk due to having diabetes.  This patient is unable to cut nails herself since the patient cannot reach her nails.These nails are painful walking and wearing shoes.  This patient presents for at risk foot care today.  She presents to the office with daughter.  General Appearance  Alert, conversant and in no acute stress.  Vascular  Dorsalis pedis and posterior tibial  pulses are weakly  palpable  bilaterally.  Capillary return is within normal limits  bilaterally. Absent pedal hair.  bilaterally.  Neurologic  Senn-Weinstein monofilament wire test within normal limits  bilaterally. Muscle power within normal limits bilaterally.  Nails Thick disfigured discolored nails with subungual debris  from hallux to fifth toes bilaterally. No evidence of bacterial infection or drainage bilaterally.  Orthopedic  No limitations of motion  feet .  No crepitus or effusions noted.  No bony pathology or digital deformities noted. Contracted fifth toe left foot.    Skin  normotropic skin with no porokeratosis noted bilaterally.  No signs of infections or ulcers noted.     Onychomycosis  Pain in right toes  Pain in left toes  Consent was obtained for treatment procedures.   Mechanical debridement of nails 1-5  bilaterally performed with a nail nipper.  Filed with dremel without incident. Padding dispensed to separate 4/5 toes left foot.   Return office visit    10 weeks                  Told patient to return for periodic foot care and evaluation due to potential at risk complications.   Gardiner Barefoot DPM

## 2020-10-02 ENCOUNTER — Other Ambulatory Visit: Payer: Self-pay

## 2020-10-02 ENCOUNTER — Inpatient Hospital Stay
Admission: EM | Admit: 2020-10-02 | Discharge: 2020-10-05 | DRG: 637 | Disposition: A | Discharge status: Home Health | Payer: Medicare HMO | Attending: Internal Medicine | Admitting: Internal Medicine

## 2020-10-02 ENCOUNTER — Emergency Department: Payer: Medicare HMO

## 2020-10-02 DIAGNOSIS — N390 Urinary tract infection, site not specified: Secondary | ICD-10-CM | POA: Diagnosis not present

## 2020-10-02 DIAGNOSIS — Z888 Allergy status to other drugs, medicaments and biological substances status: Secondary | ICD-10-CM

## 2020-10-02 DIAGNOSIS — E11649 Type 2 diabetes mellitus with hypoglycemia without coma: Secondary | ICD-10-CM | POA: Diagnosis not present

## 2020-10-02 DIAGNOSIS — Z809 Family history of malignant neoplasm, unspecified: Secondary | ICD-10-CM

## 2020-10-02 DIAGNOSIS — D3A00 Benign carcinoid tumor of unspecified site: Secondary | ICD-10-CM | POA: Diagnosis present

## 2020-10-02 DIAGNOSIS — I5032 Chronic diastolic (congestive) heart failure: Secondary | ICD-10-CM | POA: Diagnosis present

## 2020-10-02 DIAGNOSIS — G9341 Metabolic encephalopathy: Secondary | ICD-10-CM | POA: Diagnosis present

## 2020-10-02 DIAGNOSIS — I10 Essential (primary) hypertension: Secondary | ICD-10-CM | POA: Diagnosis present

## 2020-10-02 DIAGNOSIS — R609 Edema, unspecified: Secondary | ICD-10-CM | POA: Diagnosis not present

## 2020-10-02 DIAGNOSIS — Z7984 Long term (current) use of oral hypoglycemic drugs: Secondary | ICD-10-CM

## 2020-10-02 DIAGNOSIS — K219 Gastro-esophageal reflux disease without esophagitis: Secondary | ICD-10-CM | POA: Diagnosis present

## 2020-10-02 DIAGNOSIS — N179 Acute kidney failure, unspecified: Secondary | ICD-10-CM | POA: Diagnosis present

## 2020-10-02 DIAGNOSIS — E11319 Type 2 diabetes mellitus with unspecified diabetic retinopathy without macular edema: Secondary | ICD-10-CM | POA: Diagnosis present

## 2020-10-02 DIAGNOSIS — R5381 Other malaise: Secondary | ICD-10-CM

## 2020-10-02 DIAGNOSIS — R410 Disorientation, unspecified: Secondary | ICD-10-CM | POA: Diagnosis not present

## 2020-10-02 DIAGNOSIS — N2581 Secondary hyperparathyroidism of renal origin: Secondary | ICD-10-CM | POA: Diagnosis present

## 2020-10-02 DIAGNOSIS — Z7951 Long term (current) use of inhaled steroids: Secondary | ICD-10-CM

## 2020-10-02 DIAGNOSIS — N185 Chronic kidney disease, stage 5: Secondary | ICD-10-CM | POA: Diagnosis present

## 2020-10-02 DIAGNOSIS — G309 Alzheimer's disease, unspecified: Secondary | ICD-10-CM

## 2020-10-02 DIAGNOSIS — E538 Deficiency of other specified B group vitamins: Secondary | ICD-10-CM | POA: Diagnosis present

## 2020-10-02 DIAGNOSIS — R600 Localized edema: Secondary | ICD-10-CM

## 2020-10-02 DIAGNOSIS — R4182 Altered mental status, unspecified: Secondary | ICD-10-CM | POA: Diagnosis present

## 2020-10-02 DIAGNOSIS — Z7982 Long term (current) use of aspirin: Secondary | ICD-10-CM

## 2020-10-02 DIAGNOSIS — N189 Chronic kidney disease, unspecified: Secondary | ICD-10-CM

## 2020-10-02 DIAGNOSIS — Z7902 Long term (current) use of antithrombotics/antiplatelets: Secondary | ICD-10-CM

## 2020-10-02 DIAGNOSIS — E039 Hypothyroidism, unspecified: Secondary | ICD-10-CM | POA: Diagnosis present

## 2020-10-02 DIAGNOSIS — Z79890 Hormone replacement therapy (postmenopausal): Secondary | ICD-10-CM

## 2020-10-02 DIAGNOSIS — Z20822 Contact with and (suspected) exposure to covid-19: Secondary | ICD-10-CM | POA: Diagnosis present

## 2020-10-02 DIAGNOSIS — F0390 Unspecified dementia without behavioral disturbance: Secondary | ICD-10-CM | POA: Diagnosis present

## 2020-10-02 DIAGNOSIS — E1122 Type 2 diabetes mellitus with diabetic chronic kidney disease: Secondary | ICD-10-CM | POA: Diagnosis present

## 2020-10-02 DIAGNOSIS — I509 Heart failure, unspecified: Secondary | ICD-10-CM

## 2020-10-02 DIAGNOSIS — Z86012 Personal history of benign carcinoid tumor: Secondary | ICD-10-CM

## 2020-10-02 DIAGNOSIS — J45909 Unspecified asthma, uncomplicated: Secondary | ICD-10-CM | POA: Diagnosis present

## 2020-10-02 DIAGNOSIS — C7A010 Malignant carcinoid tumor of the duodenum: Secondary | ICD-10-CM | POA: Diagnosis present

## 2020-10-02 DIAGNOSIS — F0280 Dementia in other diseases classified elsewhere without behavioral disturbance: Secondary | ICD-10-CM

## 2020-10-02 DIAGNOSIS — D631 Anemia in chronic kidney disease: Secondary | ICD-10-CM | POA: Diagnosis present

## 2020-10-02 DIAGNOSIS — Z8673 Personal history of transient ischemic attack (TIA), and cerebral infarction without residual deficits: Secondary | ICD-10-CM

## 2020-10-02 DIAGNOSIS — Z79899 Other long term (current) drug therapy: Secondary | ICD-10-CM

## 2020-10-02 DIAGNOSIS — I132 Hypertensive heart and chronic kidney disease with heart failure and with stage 5 chronic kidney disease, or end stage renal disease: Secondary | ICD-10-CM | POA: Diagnosis present

## 2020-10-02 LAB — CBC WITH DIFFERENTIAL/PLATELET
Abs Immature Granulocytes: 0.03 10*3/uL (ref 0.00–0.07)
Basophils Absolute: 0.1 10*3/uL (ref 0.0–0.1)
Basophils Relative: 1 %
Eosinophils Absolute: 0.3 10*3/uL (ref 0.0–0.5)
Eosinophils Relative: 3 %
HCT: 25.3 % — ABNORMAL LOW (ref 36.0–46.0)
Hemoglobin: 8.4 g/dL — ABNORMAL LOW (ref 12.0–15.0)
Immature Granulocytes: 0 %
Lymphocytes Relative: 23 %
Lymphs Abs: 1.8 10*3/uL (ref 0.7–4.0)
MCH: 32.7 pg (ref 26.0–34.0)
MCHC: 33.2 g/dL (ref 30.0–36.0)
MCV: 98.4 fL (ref 80.0–100.0)
Monocytes Absolute: 0.6 10*3/uL (ref 0.1–1.0)
Monocytes Relative: 8 %
Neutro Abs: 5.1 10*3/uL (ref 1.7–7.7)
Neutrophils Relative %: 65 %
Platelets: 215 10*3/uL (ref 150–400)
RBC: 2.57 MIL/uL — ABNORMAL LOW (ref 3.87–5.11)
RDW: 12.4 % (ref 11.5–15.5)
WBC: 7.9 10*3/uL (ref 4.0–10.5)
nRBC: 0 % (ref 0.0–0.2)

## 2020-10-02 LAB — URINALYSIS, COMPLETE (UACMP) WITH MICROSCOPIC
Bilirubin Urine: NEGATIVE
Glucose, UA: NEGATIVE mg/dL
Hgb urine dipstick: NEGATIVE
Ketones, ur: NEGATIVE mg/dL
Nitrite: NEGATIVE
Protein, ur: >=300 mg/dL — AB
Specific Gravity, Urine: 1.008 (ref 1.005–1.030)
pH: 5 (ref 5.0–8.0)

## 2020-10-02 LAB — CBG MONITORING, ED
Glucose-Capillary: 60 mg/dL — ABNORMAL LOW (ref 70–99)
Glucose-Capillary: 96 mg/dL (ref 70–99)
Glucose-Capillary: 120 mg/dL — ABNORMAL HIGH (ref 70–99)

## 2020-10-02 LAB — COMPREHENSIVE METABOLIC PANEL
ALT: 11 U/L (ref 0–44)
AST: 16 U/L (ref 15–41)
Albumin: 3.8 g/dL (ref 3.5–5.0)
Alkaline Phosphatase: 98 U/L (ref 38–126)
Anion gap: 15 (ref 5–15)
BUN: 64 mg/dL — ABNORMAL HIGH (ref 8–23)
CO2: 19 mmol/L — ABNORMAL LOW (ref 22–32)
Calcium: 6.7 mg/dL — ABNORMAL LOW (ref 8.9–10.3)
Chloride: 101 mmol/L (ref 98–111)
Creatinine, Ser: 6.45 mg/dL — ABNORMAL HIGH (ref 0.44–1.00)
GFR, Estimated: 6 mL/min — ABNORMAL LOW (ref >60)
Glucose, Bld: 63 mg/dL — ABNORMAL LOW (ref 70–99)
Potassium: 4.7 mmol/L (ref 3.5–5.1)
Sodium: 135 mmol/L (ref 135–145)
Total Bilirubin: 0.9 mg/dL (ref 0.3–1.2)
Total Protein: 7.1 g/dL (ref 6.5–8.1)

## 2020-10-02 MED ORDER — SODIUM CHLORIDE 0.9 % IV SOLN
1.0000 g | Freq: Once | INTRAVENOUS | Status: AC
  Administered 2020-10-02: 1 g via INTRAVENOUS
  Filled 2020-10-02: qty 10

## 2020-10-02 MED ORDER — SODIUM CHLORIDE 0.9 % IV SOLN: Freq: Once | INTRAVENOUS | Status: AC

## 2020-10-02 NOTE — ED Notes (Signed)
Pt to CT

## 2020-10-02 NOTE — ED Triage Notes (Signed)
Pt brought from home by EMS with Hx of dementia. Pt was altered and BG of 58 with EMS. Pt is alert with vitals WNL.

## 2020-10-02 NOTE — ED Notes (Signed)
MD @ the bedside.

## 2020-10-02 NOTE — Progress Notes (Signed)
   10/02/20 2036  Clinical Encounter Type  Visited With Patient  Visit Type Initial;Spiritual support  Referral From Nurse  Consult/Referral To [facility] responded to a Code [facility]. Chaplain provided a ministry of presence, spiritual and emotional support. Medical team still accessing the pt, no family members present.

## 2020-10-02 NOTE — ED Triage Notes (Signed)
Hx of asthma and family gave inhaler believing her to be SOB

## 2020-10-02 NOTE — ED Provider Notes (Signed)
[facility] Emergency Department Provider Note  ____________________________________________   I have reviewed the triage vital signs and the nursing notes.   HISTORY  Chief Complaint Shortness of breath   History limited by and level 5 caveat due to: Dementia   HPI Kristi Brown is a 87 y.o. female who presents to the emergency department today from home because of concerns for shortness of breath and some altered mental status.  EMS was initially called out for shortness of breath.  Apparently family noticed the patient was having a harder time breathing this evening.  They tried giving some breathing treatments at home without any good effect.  When EMS arrived they also stated that they have noticed some slurred speech and that also started this evening.  EMS found the patient to be hypoglycemic and did give oral glucose.  EMS stated that her speech did seem to improve at the time of signout.  Patient herself has history of dementia.  She has no complaints at this time.   Records reviewed. Per medical record review patient has a history of DM, GERD, HTN.  Past Medical History:  Diagnosis Date   Diabetes mellitus without complication ([place])    GERD (gastroesophageal reflux disease)    Hypertension    Thyroid disease     Patient Active Problem List   Diagnosis Date Noted   Dementia ([place]) 03/20/2020   TIA (transient ischemic attack) 09/19/2019   History of ITP 04/25/2019   Major neurocognitive disorder due to probable Alzheimer's disease, with behavioral disturbance ([place]) 10/12/2018   Pain due to onychomycosis of toenails of both feet 08/05/2018   Diabetes mellitus without complication ([place]) 42/59/5638   Asthma 07/16/2018   Carcinoid (except of appendix) 07/16/2018   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   Dyslipidemia 11/20/2017   Anemia of renal disease 07/03/2017   Iron deficiency anemia 07/15/2016   Vitamin D deficiency 07/15/2016    'light-for-dates' infant with signs of fetal malnutrition 07/14/2016   Chronic kidney disease, unspecified 02/28/2016   GERD (gastroesophageal reflux disease) 02/28/2016   Hypothyroid 02/28/2016   Syncope 02/28/2016   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 Authorizing Provider  albuterol (VENTOLIN HFA) 108 (90 Base) MCG/ACT inhaler Inhale into the lungs. 02/26/18 07/16/19  [provider]  Alcohol Swabs (B-D SINGLE USE SWABS REGULAR) PADS To be used for checking glucose twice daily 07/22/18   [provider]  amLODipine (NORVASC) 10 MG tablet Take by mouth. 11/12/18   [provider]  aspirin 81 MG EC tablet Take by mouth.    [provider]  Assure Comfort Lancets 28G [place]  06/08/14   [provider]  Bevacizumab (AVASTIN) 100 MG/4ML SOLN by Intravitreal route. 12/19/14   [provider]  carvedilol (COREG) 25 MG tablet Take by mouth. 07/08/18 01/04/19  [provider]  clopidogrel (PLAVIX) 75 MG tablet Take 1 tablet by mouth daily. 04/25/20   [provider]  donepezil (ARICEPT) 5 MG tablet  Take by mouth. 10/12/18 10/12/19  [provider]  esomeprazole (NEXIUM) 40 MG capsule Take by mouth. 03/21/13 07/08/19  [provider]  fluticasone (FLONASE) 50 MCG/ACT nasal spray Place into both nostrils daily.    [provider]  fluticasone-salmeterol (ADVAIR HFA) 665-99  MCG/ACT inhaler 1 puff twice daily 07/16/18   [provider]  glipiZIDE (GLUCOTROL XL) 5 MG 24 hr tablet Take by mouth. 03/29/18 03/29/19  [provider]  glucose blood (PRECISION QID TEST) test strip To be used to check glucose twice daily. E11.9 02/26/18   [provider]  hydrochlorothiazide (MICROZIDE) 12.5 MG capsule Take 12.5 mg by mouth daily.    [provider]  levothyroxine (SYNTHROID) 25 MCG tablet TAKE ONE TABLET BY MOUTH EVERY MORNING 05/05/19   [provider]  levothyroxine (SYNTHROID) 25 MCG tablet Take by mouth. 01/25/20 01/24/21  [provider]  lisinopril (PRINIVIL,ZESTRIL) 10 MG tablet Take 20 mg by mouth daily. 03/11/17   [provider]  loratadine (CLARITIN) 10 MG tablet Take by mouth. 05/06/19 05/05/20  [provider]  losartan (COZAAR) 25 MG tablet Take 12.5 mg by mouth daily. 08/03/18   [provider]  memantine (NAMENDA) 5 MG tablet Take 5mg  a.m. for 2 weeks, then increase to 10mg  a.m. 02/01/19   [provider]  metFORMIN (GLUCOPHAGE) 500 MG tablet Take 500 mg by mouth daily. 08/17/18   [provider]  nitroGLYCERIN (NITROSTAT) 0.4 MG SL tablet Place under the tongue. 12/02/19 12/01/20  [provider]  pravastatin (PRAVACHOL) 40 MG tablet Take by mouth. 07/08/18 07/08/19  [provider]  QUEtiapine (SEROQUEL) 25 MG tablet TAKE 1/2 TABLET BY MOUTH TWICE DAILY. TAKE AT LUNCH & DINNER TIME 05/05/19   [provider]    Allergies Memantine  Family History  Problem Relation Age of Onset   Cancer Father     Social History Social History   Tobacco Use   Smoking status: Never   Smokeless tobacco: Never  Vaping Use   Vaping Use: Never used  Substance Use Topics   Alcohol use: No   Drug use: Never    Review of Systems Unable to obtain reliable ROS secondary to dementia.   ____________________________________________   PHYSICAL EXAM:  VITAL  SIGNS: ED Triage Vitals [10/02/20 2028]  Enc Vitals Group     BP (!) 133/108     Pulse Rate 78     Resp 18     Temp (!) 97.5 F (36.4 C)     Temp Source Oral     SpO2 98 %   Constitutional: Awake and alert. Not oriented.  Eyes: Conjunctivae are normal.  ENT      Head: Normocephalic and atraumatic.      Nose: No congestion/rhinnorhea.      Mouth/Throat: Mucous membranes are moist.      Neck: No stridor. Hematological/Lymphatic/Immunilogical: No cervical lymphadenopathy. Cardiovascular: Normal rate, regular rhythm.  No murmurs, rubs, or gallops.  Respiratory: Normal respiratory effort without tachypnea nor retractions. Breath sounds are clear and equal bilaterally. No wheezes/rales/rhonchi. Gastrointestinal: Soft and non tender. No rebound. No guarding.  Genitourinary: Deferred Musculoskeletal: Normal range of motion in all extremities. No lower extremity edema. Neurologic:  Awake and alert. Not oriented. Does not appear to be slurring her speech.Strength 5/5 in upper and lower extremities. Sensation grossly intact.  Skin:  Skin is warm, dry and intact. No rash noted. Psychiatric: Mood and affect are normal. Speech and behavior are normal. Patient exhibits appropriate  insight and judgment.  ____________________________________________    LABS (pertinent positives/negatives)  CMP na 135, k 4.7, glu 63, cr 6.45 UA clear, moderate leukocytes, 0-5 RBC, 6-10 WBC, rare bacteria CBC wbc 7.9, hgb 8.4, plt 215  ____________________________________________   EKG  I, Nance Pear, attending physician, personally viewed and interpreted this EKG  EKG Time: 2036 Rate: 61 Rhythm: sinus rhythm Axis: normal Intervals: qtc 534 QRS: RBBB, LPFB ST changes: no st elevation Impression: abnormal ekg ____________________________________________    RADIOLOGY  CT head No acute abnormality  CXR Mild pulmonary venous congestion, no overt  edema  ____________________________________________   PROCEDURES  Procedures  ____________________________________________   INITIAL IMPRESSION / ASSESSMENT AND PLAN / ED COURSE  Pertinent labs & imaging results that were available during my care of the patient were reviewed by me and considered in my medical decision making (see chart for details).   Patient presented to the emergency department today via EMS because of concerns for both shortness of breath and some slurred speech.  Was found by EMS to be hypoglycemic.  On my exam patient does not appear to be slurring any speech.  No focal neurodeficits.  Patient does have history of dementia.  Patient had x-ray performed which not show any concerning pneumonia.  Initial CBG here was low however did improve likely secondary to the oral glucose given by EMS.  Work-up is concerning for urinary tract infection which could Splane some of the patient's symptoms.  Additionally blood work shows elevated creatinine.  Patient does have a history of chronic kidney disease this is somewhat worsening over what appears to be her baseline.  Anemia does appear to be roughly patient's baseline.  Will plan on admission for further work-up and management of UTI and AKI.  ____________________________________________   FINAL CLINICAL IMPRESSION(S) / ED DIAGNOSES  Final diagnoses:  AKI (acute kidney injury) ([place])  Lower urinary tract infectious disease     Note: This dictation was prepared with Dragon dictation. Any transcriptional errors that result from this process are unintentional     Nance Pear, MD 10/02/20 2308

## 2020-10-03 ENCOUNTER — Observation Stay: Payer: Medicare HMO

## 2020-10-03 ENCOUNTER — Encounter: Payer: Self-pay | Admitting: Family Medicine

## 2020-10-03 ENCOUNTER — Observation Stay (HOSPITAL_COMMUNITY)
Admit: 2020-10-03 | Discharge: 2020-10-03 | Disposition: A | Discharge status: Home / Self Care | Payer: Medicare HMO | Attending: Family Medicine | Admitting: Family Medicine

## 2020-10-03 DIAGNOSIS — N189 Chronic kidney disease, unspecified: Secondary | ICD-10-CM | POA: Diagnosis not present

## 2020-10-03 DIAGNOSIS — E039 Hypothyroidism, unspecified: Secondary | ICD-10-CM | POA: Diagnosis present

## 2020-10-03 DIAGNOSIS — R0609 Other forms of dyspnea: Secondary | ICD-10-CM

## 2020-10-03 DIAGNOSIS — E11649 Type 2 diabetes mellitus with hypoglycemia without coma: Secondary | ICD-10-CM

## 2020-10-03 DIAGNOSIS — J45909 Unspecified asthma, uncomplicated: Secondary | ICD-10-CM | POA: Diagnosis present

## 2020-10-03 DIAGNOSIS — N185 Chronic kidney disease, stage 5: Secondary | ICD-10-CM | POA: Diagnosis present

## 2020-10-03 DIAGNOSIS — R410 Disorientation, unspecified: Secondary | ICD-10-CM | POA: Diagnosis not present

## 2020-10-03 DIAGNOSIS — Z20822 Contact with and (suspected) exposure to covid-19: Secondary | ICD-10-CM | POA: Diagnosis present

## 2020-10-03 DIAGNOSIS — N2581 Secondary hyperparathyroidism of renal origin: Secondary | ICD-10-CM | POA: Diagnosis present

## 2020-10-03 DIAGNOSIS — I5032 Chronic diastolic (congestive) heart failure: Secondary | ICD-10-CM | POA: Diagnosis present

## 2020-10-03 DIAGNOSIS — E1122 Type 2 diabetes mellitus with diabetic chronic kidney disease: Secondary | ICD-10-CM | POA: Diagnosis present

## 2020-10-03 DIAGNOSIS — E538 Deficiency of other specified B group vitamins: Secondary | ICD-10-CM | POA: Diagnosis present

## 2020-10-03 DIAGNOSIS — D631 Anemia in chronic kidney disease: Secondary | ICD-10-CM | POA: Diagnosis present

## 2020-10-03 DIAGNOSIS — Z888 Allergy status to other drugs, medicaments and biological substances status: Secondary | ICD-10-CM | POA: Diagnosis not present

## 2020-10-03 DIAGNOSIS — Z7984 Long term (current) use of oral hypoglycemic drugs: Secondary | ICD-10-CM | POA: Diagnosis not present

## 2020-10-03 DIAGNOSIS — N390 Urinary tract infection, site not specified: Secondary | ICD-10-CM

## 2020-10-03 DIAGNOSIS — I132 Hypertensive heart and chronic kidney disease with heart failure and with stage 5 chronic kidney disease, or end stage renal disease: Secondary | ICD-10-CM | POA: Diagnosis present

## 2020-10-03 DIAGNOSIS — K219 Gastro-esophageal reflux disease without esophagitis: Secondary | ICD-10-CM | POA: Diagnosis present

## 2020-10-03 DIAGNOSIS — Z7982 Long term (current) use of aspirin: Secondary | ICD-10-CM | POA: Diagnosis not present

## 2020-10-03 DIAGNOSIS — N179 Acute kidney failure, unspecified: Secondary | ICD-10-CM | POA: Diagnosis present

## 2020-10-03 DIAGNOSIS — Z7902 Long term (current) use of antithrombotics/antiplatelets: Secondary | ICD-10-CM | POA: Diagnosis not present

## 2020-10-03 DIAGNOSIS — F0390 Unspecified dementia without behavioral disturbance: Secondary | ICD-10-CM | POA: Diagnosis present

## 2020-10-03 DIAGNOSIS — E11319 Type 2 diabetes mellitus with unspecified diabetic retinopathy without macular edema: Secondary | ICD-10-CM | POA: Diagnosis present

## 2020-10-03 DIAGNOSIS — G9341 Metabolic encephalopathy: Secondary | ICD-10-CM | POA: Diagnosis present

## 2020-10-03 DIAGNOSIS — R609 Edema, unspecified: Secondary | ICD-10-CM | POA: Diagnosis present

## 2020-10-03 DIAGNOSIS — Z79899 Other long term (current) drug therapy: Secondary | ICD-10-CM | POA: Diagnosis not present

## 2020-10-03 DIAGNOSIS — Z8673 Personal history of transient ischemic attack (TIA), and cerebral infarction without residual deficits: Secondary | ICD-10-CM | POA: Diagnosis not present

## 2020-10-03 DIAGNOSIS — R600 Localized edema: Secondary | ICD-10-CM

## 2020-10-03 LAB — CBG MONITORING, ED
Glucose-Capillary: 117 mg/dL — ABNORMAL HIGH (ref 70–99)
Glucose-Capillary: 49 mg/dL — ABNORMAL LOW (ref 70–99)
Glucose-Capillary: 69 mg/dL — ABNORMAL LOW (ref 70–99)
Glucose-Capillary: 77 mg/dL (ref 70–99)
Glucose-Capillary: 86 mg/dL (ref 70–99)
Glucose-Capillary: 103 mg/dL — ABNORMAL HIGH (ref 70–99)
Glucose-Capillary: 123 mg/dL — ABNORMAL HIGH (ref 70–99)
Glucose-Capillary: 143 mg/dL — ABNORMAL HIGH (ref 70–99)

## 2020-10-03 LAB — HEMOGLOBIN A1C
Hgb A1c MFr Bld: 5 % (ref 4.8–5.6)
Mean Plasma Glucose: 96.8 mg/dL

## 2020-10-03 LAB — ECHOCARDIOGRAM COMPLETE
AR max vel: 1.44 cm2
AV Area VTI: 1.3 cm2
AV Area mean vel: 1.37 cm2
AV Mean grad: 7 mmHg
AV Peak grad: 13 mmHg
Ao pk vel: 1.8 m/s
Area-P 1/2: 4.36 cm2
Height: 64 in
MV VTI: 1.86 cm2
P 1/2 time: 346 msec
S' Lateral: 2.4 cm
Weight: 2680.79 oz

## 2020-10-03 LAB — BASIC METABOLIC PANEL
Anion gap: 12 (ref 5–15)
BUN: 64 mg/dL — ABNORMAL HIGH (ref 8–23)
CO2: 21 mmol/L — ABNORMAL LOW (ref 22–32)
Calcium: 6.4 mg/dL — CL (ref 8.9–10.3)
Chloride: 104 mmol/L (ref 98–111)
Creatinine, Ser: 6.63 mg/dL — ABNORMAL HIGH (ref 0.44–1.00)
GFR, Estimated: 6 mL/min — ABNORMAL LOW (ref >60)
Glucose, Bld: 65 mg/dL — ABNORMAL LOW (ref 70–99)
Potassium: 4.5 mmol/L (ref 3.5–5.1)
Sodium: 137 mmol/L (ref 135–145)

## 2020-10-03 LAB — RESP PANEL BY RT-PCR (FLU A&B, COVID) ARPGX2
Influenza A by PCR: NEGATIVE
Influenza B by PCR: NEGATIVE
SARS Coronavirus 2 by RT PCR: NEGATIVE

## 2020-10-03 LAB — VITAMIN D 25 HYDROXY (VIT D DEFICIENCY, FRACTURES): Vit D, 25-Hydroxy: 38 ng/mL (ref 30–100)

## 2020-10-03 LAB — ALBUMIN: Albumin: 3.3 g/dL — ABNORMAL LOW (ref 3.5–5.0)

## 2020-10-03 LAB — PHOSPHORUS: Phosphorus: 9.1 mg/dL — ABNORMAL HIGH (ref 2.5–4.6)

## 2020-10-03 MED ORDER — LOSARTAN POTASSIUM 25 MG PO TABS
12.5000 mg | ORAL_TABLET | Freq: Every day | ORAL | Status: DC
  Administered 2020-10-03 – 2020-10-05 (×3): 12.5 mg via ORAL
  Filled 2020-10-03 (×4): qty 0.5
  Filled 2020-10-03: qty 1

## 2020-10-03 MED ORDER — PANTOPRAZOLE SODIUM 40 MG PO TBEC: 40.0000 mg | DELAYED_RELEASE_TABLET | Freq: Every day | ORAL | Status: DC

## 2020-10-03 MED ORDER — DONEPEZIL HCL 5 MG PO TABS
10.0000 mg | ORAL_TABLET | Freq: Every day | ORAL | Status: DC
  Administered 2020-10-03 – 2020-10-04 (×2): 10 mg via ORAL
  Filled 2020-10-03 (×2): qty 2

## 2020-10-03 MED ORDER — ENOXAPARIN SODIUM 40 MG/0.4ML IJ SOSY: 40.0000 mg | PREFILLED_SYRINGE | INTRAMUSCULAR | Status: DC

## 2020-10-03 MED ORDER — CALCIUM GLUCONATE-NACL 1-0.675 GM/50ML-% IV SOLN
1.0000 g | Freq: Once | INTRAVENOUS | Status: AC
  Administered 2020-10-03: 1000 mg via INTRAVENOUS
  Filled 2020-10-03: qty 50

## 2020-10-03 MED ORDER — SODIUM CHLORIDE 0.9 % IV SOLN: 1.0000 g | INTRAVENOUS | Status: DC

## 2020-10-03 MED ORDER — PANTOPRAZOLE SODIUM 40 MG PO TBEC
40.0000 mg | DELAYED_RELEASE_TABLET | Freq: Every day | ORAL | Status: DC
  Administered 2020-10-03 – 2020-10-05 (×3): 40 mg via ORAL
  Filled 2020-10-03 (×3): qty 1

## 2020-10-03 MED ORDER — LEVOTHYROXINE SODIUM 25 MCG PO TABS
25.0000 ug | ORAL_TABLET | Freq: Every morning | ORAL | Status: DC
  Administered 2020-10-03 – 2020-10-05 (×2): 25 ug via ORAL
  Filled 2020-10-03 (×3): qty 1

## 2020-10-03 MED ORDER — AMLODIPINE BESYLATE 10 MG PO TABS
10.0000 mg | ORAL_TABLET | Freq: Every day | ORAL | Status: DC
  Administered 2020-10-03 – 2020-10-05 (×3): 10 mg via ORAL
  Filled 2020-10-03 (×3): qty 2
  Filled 2020-10-03: qty 1

## 2020-10-03 MED ORDER — ASPIRIN EC 81 MG PO TBEC
81.0000 mg | DELAYED_RELEASE_TABLET | Freq: Every day | ORAL | Status: DC
  Administered 2020-10-03 – 2020-10-05 (×3): 81 mg via ORAL
  Filled 2020-10-03 (×3): qty 1

## 2020-10-03 MED ORDER — HEPARIN SODIUM (PORCINE) 5000 UNIT/ML IJ SOLN
5000.0000 [IU] | Freq: Three times a day (TID) | INTRAMUSCULAR | Status: DC
  Administered 2020-10-03 – 2020-10-05 (×7): 5000 [IU] via SUBCUTANEOUS
  Filled 2020-10-03 (×7): qty 1

## 2020-10-03 MED ORDER — QUETIAPINE FUMARATE 25 MG PO TABS
25.0000 mg | ORAL_TABLET | Freq: Every day | ORAL | Status: DC
  Administered 2020-10-03 – 2020-10-04 (×2): 25 mg via ORAL
  Filled 2020-10-03 (×2): qty 1

## 2020-10-03 MED ORDER — LACTATED RINGERS IV SOLN: INTRAVENOUS | Status: DC

## 2020-10-03 MED ORDER — CARVEDILOL 25 MG PO TABS
25.0000 mg | ORAL_TABLET | Freq: Two times a day (BID) | ORAL | Status: DC
  Administered 2020-10-03 – 2020-10-05 (×5): 25 mg via ORAL
  Filled 2020-10-03: qty 1
  Filled 2020-10-03 (×4): qty 4
  Filled 2020-10-03: qty 8

## 2020-10-03 MED ORDER — HYDROCHLOROTHIAZIDE 12.5 MG PO CAPS
12.5000 mg | ORAL_CAPSULE | Freq: Every day | ORAL | Status: DC
  Administered 2020-10-03 – 2020-10-05 (×3): 12.5 mg via ORAL
  Filled 2020-10-03 (×4): qty 1

## 2020-10-03 NOTE — H&P (Addendum)
History and Physical    Kristi Brown GUR:[id number] DOB: Oct 10, 1929 DOA: 10/02/2020  PCP: Danae Orleans, MD  Patient coming from: Home, daughter at bedside  I have personally briefly reviewed patient's old medical records in [place]  Chief Complaint: Increasing shortness of breath, slurred speech  HPI: Kristi Brown is a 87 y.o. female with medical history significant for dementia, hypertension, type 2 diabetes with retinopathy, CKD stage V not on dialysis, hypothyroidism, asthma, TIA, carcinoid tumor, history of ITP and GERD who presents with concerns of increasing shortness of breath and slurred speech.  Has dementia alert and oriented to self.  She lives with her daughter who helps her with all her daily ADLs.  Daughter reports that yesterday she noticed patient had some slurred speech.  Today at home she became diaphoretic all of her extremities went limp.  She was not able to speak well.  Also had episode of diarrhea early this morning.  EMS was called and patient was noted to have BG 58 and was given oral glucose.  Patient is only on metformin for diabetes and ate meals today like normal. Daughter also notes that she has been short of breath for the past year but was worse yesterday. Notes bilateral lower extremity for 2 to 3 weeks.  States patient has complained of chest pain.  Several months ago.  ED Course: She was afebrile, normotensive on room air.  Glucose improved to 120 following oral glucose with EMS without any further interventions in the ED.  No leukocytosis.  Hemoglobin of 8.4.  Creatinine was elevated to 6.45 from a prior of 5.49 back in June. UA shows moderate leukocyte, negative nitrite and she was started on IV Rocephin. Chest x-ray showed mild pulmonary venous congestion.  CT head showed chronic right basal ganglia lacunar infarct no acute findings.  Review of Systems:    Past Medical History:  Diagnosis Date   Diabetes mellitus without complication  ([place])    GERD (gastroesophageal reflux disease)    Hypertension    Thyroid disease     Past Surgical History:  Procedure Laterality Date   ABDOMINAL HYSTERECTOMY       reports that she has never smoked. She has never used smokeless tobacco. She reports that she does not drink alcohol and does not use drugs. Social History  Allergies  Allergen Reactions   Memantine Other (See Comments)    Excessive sedation Excessive sedation    Family History  Problem Relation Age of Onset   Cancer Father      Prior to Admission medications   Medication Sig Start Date End Date Taking? Authorizing Provider  albuterol (VENTOLIN HFA) 108 (90 Base) MCG/ACT inhaler Inhale into the lungs. 02/26/18 07/16/19  [provider]  Alcohol Swabs (B-D SINGLE USE SWABS REGULAR) PADS To be used for checking glucose twice daily 07/22/18   [provider]  amLODipine (NORVASC) 10 MG tablet Take by mouth. 11/12/18   [provider]  aspirin 81 MG EC tablet Take by mouth.    [provider]  Assure Comfort Lancets 28G [place]  06/08/14   [provider]  Bevacizumab (AVASTIN) 100 MG/4ML SOLN by Intravitreal route. 12/19/14   [provider]  carvedilol (COREG) 25 MG tablet Take by mouth. 07/08/18 01/04/19  [provider]  clopidogrel (PLAVIX) 75 MG tablet Take 1 tablet by mouth daily. 04/25/20   [provider]  donepezil (ARICEPT) 5 MG tablet Take by mouth. 10/12/18 10/12/19  [provider]  esomeprazole (NEXIUM) 40 MG capsule Take by mouth. 03/21/13 07/08/19  [provider]  fluticasone (FLONASE) 50 MCG/ACT nasal spray Place into both nostrils daily.    [provider]  fluticasone-salmeterol (ADVAIR HFA) 628-36 MCG/ACT inhaler 1 puff twice daily 07/16/18   [provider]  glipiZIDE (GLUCOTROL XL) 5 MG 24 hr tablet Take by mouth. 03/29/18 03/29/19  [provider]  glucose blood (PRECISION QID TEST) test strip To  be used to check glucose twice daily. E11.9 02/26/18   [provider]  hydrochlorothiazide (MICROZIDE) 12.5 MG capsule Take 12.5 mg by mouth daily.    [provider]  levothyroxine (SYNTHROID) 25 MCG tablet TAKE ONE TABLET BY MOUTH EVERY MORNING 05/05/19   [provider]  levothyroxine (SYNTHROID) 25 MCG tablet Take by mouth. 01/25/20 01/24/21  [provider]  lisinopril (PRINIVIL,ZESTRIL) 10 MG tablet Take 20 mg by mouth daily. 03/11/17   [provider]  loratadine (CLARITIN) 10 MG tablet Take by mouth. 05/06/19 05/05/20  [provider]  losartan (COZAAR) 25 MG tablet Take 12.5 mg by mouth daily. 08/03/18   [provider]  memantine (NAMENDA) 5 MG tablet Take 5mg  a.m. for 2 weeks, then increase to 10mg  a.m. 02/01/19   [provider]  metFORMIN (GLUCOPHAGE) 500 MG tablet Take 500 mg by mouth daily. 08/17/18   [provider]  nitroGLYCERIN (NITROSTAT) 0.4 MG SL tablet Place under the tongue. 12/02/19 12/01/20  [provider]  pravastatin (PRAVACHOL) 40 MG tablet Take by mouth. 07/08/18 07/08/19  [provider]  QUEtiapine (SEROQUEL) 25 MG tablet TAKE 1/2 TABLET BY MOUTH TWICE DAILY. TAKE AT LUNCH & DINNER TIME 05/05/19   [provider]    Physical Exam: Vitals:   10/02/20 2141 10/02/20 2237 10/02/20 2330 10/02/20 2355  BP:  135/62 (!) 144/76   Pulse:  65 61   Resp:  20 18   Temp: 98 F (36.7 C)     TempSrc: Oral     SpO2:  100% 98%   Weight:    76 kg  Height:    5\' 4"  (1.626 m)    Constitutional: NAD, calm, comfortable, elderly female laying flat in bed Vitals:   10/02/20 2141 10/02/20 2237 10/02/20 2330 10/02/20 2355  BP:  135/62 (!) 144/76   Pulse:  65 61   Resp:  20 18   Temp: 98 F (36.7 C)     TempSrc: Oral     SpO2:  100% 98%   Weight:    76 kg  Height:    5\' 4"  (1.626 m)   Eyes: PERRL, lids and conjunctivae normal ENMT: Mucous membranes are moist.  Neck: normal,  supple Respiratory: clear to auscultation bilaterally, no wheezing, no crackles. Normal respiratory effort. No accessory muscle use.  Cardiovascular: Regular rate and rhythm, no murmurs / rubs / gallops.  +3 pitting edema of the lower extremity up to mid pretibial region.   Abdomen: no tenderness, no masses palpated.  Bowel sounds positive.  Musculoskeletal: no clubbing / cyanosis. No joint deformity upper and lower extremities. Good ROM, no contractures. Normal muscle tone.  Skin: no rashes, lesions, ulcers. No induration Neurologic: No facial asymmetry.  No noted dysarthria.  CN 2-12 grossly intact. Sensation intact, Strength 5/5 in all 4.  Psychiatric: Patient alert and oriented only to self and initially did not recognize daughter but later able to tell me her name   Labs on Admission: I have personally reviewed following labs and  imaging studies  CBC: Recent Labs  Lab 10/02/20 2038  WBC 7.9  NEUTROABS 5.1  HGB 8.4*  HCT 25.3*  MCV 98.4  PLT 498   Basic Metabolic Panel: Recent Labs  Lab 10/02/20 2038  NA 135  K 4.7  CL 101  CO2 19*  GLUCOSE 63*  BUN 64*  CREATININE 6.45*  CALCIUM 6.7*   GFR: Estimated Creatinine Clearance: 5.9 mL/min (A) (by C-G formula based on SCr of 6.45 mg/dL (H)). Liver Function Tests: Recent Labs  Lab 10/02/20 2038  AST 16  ALT 11  ALKPHOS 98  BILITOT 0.9  PROT 7.1  ALBUMIN 3.8   No results for input(s): LIPASE, AMYLASE in the last 168 hours. No results for input(s): AMMONIA in the last 168 hours. Coagulation Profile: No results for input(s): INR, PROTIME in the last 168 hours. Cardiac Enzymes: No results for input(s): CKTOTAL, CKMB, CKMBINDEX, TROPONINI in the last 168 hours. BNP (last 3 results) No results for input(s): PROBNP in the last 8760 hours. HbA1C: No results for input(s): HGBA1C in the last 72 hours. CBG: Recent Labs  Lab 10/02/20 2026 10/02/20 2051 10/02/20 2219  GLUCAP 60* 96 120*   Lipid Profile: No results  for input(s): CHOL, HDL, LDLCALC, TRIG, CHOLHDL, LDLDIRECT in the last 72 hours. Thyroid Function Tests: No results for input(s): TSH, T4TOTAL, FREET4, T3FREE, THYROIDAB in the last 72 hours. Anemia Panel: No results for input(s): VITAMINB12, FOLATE, FERRITIN, TIBC, IRON, RETICCTPCT in the last 72 hours. Urine analysis:    Component Value Date/Time   COLORURINE STRAW (A) 10/02/2020 2129   APPEARANCEUR CLEAR (A) 10/02/2020 2129   LABSPEC 1.008 10/02/2020 2129   PHURINE 5.0 10/02/2020 2129   GLUCOSEU NEGATIVE 10/02/2020 2129   HGBUR NEGATIVE 10/02/2020 2129   BILIRUBINUR NEGATIVE 10/02/2020 2129   KETONESUR NEGATIVE 10/02/2020 2129   PROTEINUR >=300 (A) 10/02/2020 2129   NITRITE NEGATIVE 10/02/2020 2129   LEUKOCYTESUR MODERATE (A) 10/02/2020 2129    Radiological Exams on Admission: CT HEAD WO CONTRAST (5MM)  Result Date: 10/02/2020 CLINICAL DATA:  Dementia, altered level of consciousness, hyperglycemia EXAM: CT HEAD WITHOUT CONTRAST TECHNIQUE: Contiguous axial images were obtained from the base of the skull through the vertex without intravenous contrast. COMPARISON:  09/29/2019 FINDINGS: Brain: No acute infarct or hemorrhage. Stable chronic right basal ganglia lacunar infarct. Lateral ventricles and remaining midline structures are unremarkable. No acute extra-axial fluid collections. No mass effect. Vascular: Extensive atherosclerosis.  No hyperdense vessel. Skull: Normal. Negative for fracture or focal lesion. Sinuses/Orbits: Mild polypoid mucosal thickening within the sphenoid sinuses. Remaining sinuses are clear. Other: None. IMPRESSION: 1. Stable head CT, no acute intracranial process. Electronically Signed   By: Randa Ngo M.D.   On: 10/02/2020 22:57   DG Chest Portable 1 View  Result Date: 10/02/2020 CLINICAL DATA:  Shortness of breath EXAM: PORTABLE CHEST 1 VIEW COMPARISON:  None. FINDINGS: Normal mediastinum and cardiac silhouette. Mild venous congestion. No evidence of  effusion, infiltrate, or pneumothorax. No acute bony abnormality. IMPRESSION: Mild pulmonary venous congestion.  No overt pulmonary edema. Electronically Signed   By: Suzy Bouchard M.D.   On: 10/02/2020 21:10      Assessment/Plan  Acute metabolic encephalopathy w/baseline dementia - Likely multifactorial with UTI, AKI with likely increased uremia, and hypoglycemia - Tx as below  - baseline oriented only to self and sometimes to family   Hypoglycemia w/hx of Type 2 diabetes - Patient on metformin for diabetes.  - Check CBG every 2 hours until the morning -  Hemoglobin of 5.6 in July.  Given how well controlled her diabetes is, would recommend considering discontinue metformin at discharge.  UTI - UA with moderate leukocyte, negative nitrite - Continue IV Rocephin  AKI on CKD stage V -Creatinine of 6.45 from prior of 5.49 in June - Patient follows with nephrology outpatient and family has repeatedly declined hemodialysis and currently continues to express the same wishes  Lower extremity edema - Edema worse on the left.  Obtain venous Doppler ultrasound to rule out DVT -Also will check echocardiogram given pulmonary vascular congestion seen on chest x-ray.  Although suspect edema more likely related to worsening CKD/proteinuria  Neuroendocrine/Carcinoid tumor -s/p resection in 2017 and previously on Octreotid. Has been released by oncology and only follows up PRN   Hypertension -continue home antihypertensives  Hypothyroidism -Continue levothyroxine   Hx of CVA -continue aspirn   Dementia - Continue Seroquel and Aricept - Baseline alert oriented only to self and sometimes family  DVT prophylaxis:.Subcu heparin Code Status: Full Family Communication: Plan discussed with daughter at bedside  disposition Plan: Home with observation Consults called:  Admission status: Observation    Level of care: Med-Surg  Status is: Observation  The patient remains OBS  appropriate and will d/c before 2 midnights.  Dispo: The patient is from: Home              Anticipated d/c is to: Home              Patient currently is not medically stable to d/c.   Difficult to place patient No         Orene Desanctis DO [facility]   If 7PM-7AM, please contact night-coverage [email or web address]   10/03/2020, 12:11 AM

## 2020-10-03 NOTE — Progress Notes (Signed)
Inpatient Diabetes Program Recommendations  AACE/ADA: New Consensus Statement on Inpatient Glycemic Control (2015)  Target Ranges:  Prepandial:   less than 140 mg/dL      Peak postprandial:   less than 180 mg/dL (1-2 hours)      Critically ill patients:  140 - 180 mg/dL   Lab Results  Component Value Date   GLUCAP 103 (H) 10/03/2020    Review of Glycemic Control  Diabetes history: DM2 Outpatient Diabetes medications: Glucotrol XL 5 mg qd, Metformin 500 mg qd Current orders for Inpatient glycemic control: None  Inpatient Diabetes Program Recommendations:   Noted A1c from [facility] 5.6 on 07/30/20. Consider discontinuing diabetes meds on discharge and followup with doctor post discharge.  Thank you, Nani Gasser. Jessyca Sloan, RN, MSN, CDE  Diabetes Coordinator Inpatient Glycemic Control Team Team Pager [phone] (8am-5pm) 10/03/2020 11:15 AM

## 2020-10-03 NOTE — Progress Notes (Signed)
*  PRELIMINARY RESULTS* Echocardiogram 2D Echocardiogram has been performed.  Kristi Brown 10/03/2020, 9:53 AM

## 2020-10-03 NOTE — Progress Notes (Addendum)
Progress Note    Kristi Brown  NUU:[id number] DOB: 1929/08/23  DOA: 10/02/2020 PCP: Kristi Orleans, MD      Brief Narrative:    Medical records reviewed and are as summarized below:  Kristi Brown is a 87 y.o. female  with medical history significant for dementia, hypertension, type 2 diabetes with retinopathy, CKD stage V not on dialysis, hypothyroidism, asthma, TIA, carcinoid tumor, history of ITP and GERD, who was brought to the hospital because of shortness of breath, diaphoresis, weakness in the extremities, lower extremity edema and slurred speech.  Reportedly, EMS discovered that her blood sugar was 58.  She takes glipizide, according to her daughters, for diabetes.   She was admitted to the hospital for hypoglycemia, acute metabolic encephalopathy, AKI on CKD stage V and suspected UTI   Assessment/Plan:   Active Problems:   Carcinoid (except of appendix)   Dementia (Kristi Brown)   HTN (hypertension)   Malignant carcinoid tumor of duodenum (Kristi Brown)   AMS (altered mental status)   Type 2 diabetes mellitus with hypoglycemia without coma (Kristi Brown)   Acute lower UTI   Acute kidney injury superimposed on chronic kidney disease (Kristi Brown)   Bilateral lower extremity edema    Body mass index is 28.76 kg/m.  Acute metabolic encephalopathy with underlying dementia: Mental status was improved to baseline.  Continue to monitor.  Hypoglycemia: Resolved.  Check hemoglobin A1c.  She has metformin and glipizide on her admission med rec but her daughter said she only takes glipizide at home.  Hold oral hypoglycemics.  Suspected UTI: Continue IV Rocephin for now.  Follow-up urine culture.  AKI on CKD stage V: Creatinine was 5.49 on 06/26/2020.  Creatinine was 3.69 about a year ago. Kristi Brown confirmed that they are not interested in hemodialysis because of advanced age.  She wanted to know what else could be done for the kidneys to improve.  We discussed trial of cautious IV fluids for hydration  keeping in mind that she is at increased risk for fluid overload..  Monitor BMP.  Hypocalcemia: Treat with IV calcium gluconate.  Monitor calcium level.  Check phosphorus and vitamin D level.  Chronic diastolic CHF: 2D echo showed EF estimated at 65 to 34%, grade 2 diastolic dysfunction, mildly elevated pulmonary artery systolic pressure, mild MR, mild AR.  Hypertension: Continue antihypertensives  Mild lower extremity edema: Venous duplex did not show any evidence of DVT  Other comorbidities include history of stroke, hypothyroidism, neuroendocrine/carcinoid tumor s/p resection in 2017   Diet Order             Diet Heart Room service appropriate? Yes; Fluid consistency: Thin  Diet effective now                      Consultants: None  Procedures: None    Medications:    amLODipine  10 mg Oral Daily   aspirin EC  81 mg Oral Daily   carvedilol  25 mg Oral BID WC   donepezil  10 mg Oral QHS   heparin injection (subcutaneous)  5,000 Units Subcutaneous Q8H   hydrochlorothiazide  12.5 mg Oral Daily   levothyroxine  25 mcg Oral q morning   losartan  12.5 mg Oral Daily   pantoprazole  40 mg Oral Daily   QUEtiapine  12.5 mg Oral QHS   Continuous Infusions:  [START ON 10/04/2020] cefTRIAXone (ROCEPHIN)  IV       Anti-infectives (From admission, onward)    Start  Dose/Rate Route Frequency Ordered Stop   10/04/20 2200  cefTRIAXone (ROCEPHIN) 1 g in sodium chloride 0.9 % 100 mL IVPB        1 g 200 mL/hr over 30 Minutes Intravenous Every 24 hours 10/03/20 0009     10/02/20 2215  cefTRIAXone (ROCEPHIN) 1 g in sodium chloride 0.9 % 100 mL IVPB        1 g 200 mL/hr over 30 Minutes Intravenous  Once 10/02/20 2210 10/02/20 2306              Family Communication/Anticipated D/C date and plan/Code Status   DVT prophylaxis: heparin injection 5,000 Units Start: 10/03/20 0015     Code Status: Full Code  Family Communication: Kristi Brown and Kristi Brown Disposition  Plan:    Status is: Observation  The patient will require care spanning > 2 midnights and should be moved to inpatient because: IV treatments appropriate due to intensity of illness or inability to take PO and Inpatient level of care appropriate due to severity of illness  Dispo: The patient is from: Home              Anticipated d/c is to: Home              Patient currently is not medically stable to d/c.   Difficult to place patient No           Subjective:   Interval events noted.  She has no complaints.  No dizziness, shortness of breath, chest pain  Objective:    Vitals:   10/03/20 0521 10/03/20 0757 10/03/20 1023 10/03/20 1100  BP: (!) 169/62 (!) 179/83 (!) 162/60 139/62  Pulse: 71 72 62 (!) 56  Resp: 16 18 17 19   Temp:  97.6 F (36.4 C)    TempSrc:  Oral    SpO2: 95% 94% 96% 100%  Weight:      Height:       No data found.   Intake/Output Summary (Last 24 hours) at 10/03/2020 1144 Last data filed at 10/02/2020 2306 Gross per 24 hour  Intake 61.96 ml  Output --  Net 61.96 ml   Filed Weights   10/02/20 2355  Weight: 76 kg    Exam:  GEN: NAD SKIN: No rash EYES: EOMI ENT: MMM CV: RRR PULM: CTA B ABD: soft, ND, NT, +BS CNS: AAO x 2 (person and place), non focal EXT: Bilateral leg edema (1+) no erythema or tenderness        Data Reviewed:   I have personally reviewed following labs and imaging studies:  Labs: Labs show the following:   Basic Metabolic Panel: Recent Labs  Lab 10/02/20 2038 10/03/20 0511  NA 135 137  K 4.7 4.5  CL 101 104  CO2 19* 21*  GLUCOSE 63* 65*  BUN 64* 64*  CREATININE 6.45* 6.63*  CALCIUM 6.7* 6.4*   GFR Estimated Creatinine Clearance: 5.7 mL/min (A) (by C-G formula based on SCr of 6.63 mg/dL (H)). Liver Function Tests: Recent Labs  Lab 10/02/20 2038 10/03/20 0511  AST 16  --   ALT 11  --   ALKPHOS 98  --   BILITOT 0.9  --   PROT 7.1  --   ALBUMIN 3.8 3.3*   No results for input(s):  LIPASE, AMYLASE in the last 168 hours. No results for input(s): AMMONIA in the last 168 hours. Coagulation profile No results for input(s): INR, PROTIME in the last 168 hours.  CBC: Recent Labs  Lab 10/02/20 2038  WBC 7.9  NEUTROABS 5.1  HGB 8.4*  HCT 25.3*  MCV 98.4  PLT 215   Cardiac Enzymes: No results for input(s): CKTOTAL, CKMB, CKMBINDEX, TROPONINI in the last 168 hours. BNP (last 3 results) No results for input(s): PROBNP in the last 8760 hours. CBG: Recent Labs  Lab 10/03/20 0515 10/03/20 0518 10/03/20 0700 10/03/20 0815 10/03/20 1045  GLUCAP 49* 69* 77 86 103*   D-Dimer: No results for input(s): DDIMER in the last 72 hours. Hgb A1c: No results for input(s): HGBA1C in the last 72 hours. Lipid Profile: No results for input(s): CHOL, HDL, LDLCALC, TRIG, CHOLHDL, LDLDIRECT in the last 72 hours. Thyroid function studies: No results for input(s): TSH, T4TOTAL, T3FREE, THYROIDAB in the last 72 hours.  Invalid input(s): FREET3 Anemia work up: No results for input(s): VITAMINB12, FOLATE, FERRITIN, TIBC, IRON, RETICCTPCT in the last 72 hours. Sepsis Labs: Recent Labs  Lab 10/02/20 2038  WBC 7.9    Microbiology Recent Results (from the past 240 hour(s))  Resp Panel by RT-PCR (Flu A&B, Covid) Nasopharyngeal Swab     Status: None   Collection Time: 10/02/20 11:11 PM   Specimen: Nasopharyngeal Swab; Nasopharyngeal(NP) swabs in vial transport medium  Result Value Ref Range Status   SARS Coronavirus 2 by RT PCR NEGATIVE NEGATIVE Final    Comment: (NOTE) SARS-CoV-2 target nucleic acids are NOT DETECTED.  The SARS-CoV-2 RNA is generally detectable in upper respiratory specimens during the acute phase of infection. The lowest concentration of SARS-CoV-2 viral copies this assay can detect is 138 copies/mL. A negative result does not preclude SARS-Cov-2 infection and should not be used as the sole basis for treatment or other patient management decisions. A  negative result may occur with  improper specimen collection/handling, submission of specimen other than nasopharyngeal swab, presence of viral mutation(s) within the areas targeted by this assay, and inadequate number of viral copies(<138 copies/mL). A negative result must be combined with clinical observations, patient history, and epidemiological information. The expected result is Negative.  Fact Sheet for Patients:  EntrepreneurPulse.com.au  Fact Sheet for Healthcare Providers:  IncredibleEmployment.be  This test is no t yet approved or cleared by the [place] FDA and  has been authorized for detection and/or diagnosis of SARS-CoV-2 by FDA under an Emergency Use Authorization (EUA). This EUA will remain  in effect (meaning this test can be used) for the duration of the COVID-19 declaration under Section 564(b)(1) of the Act, 21 U.S.C.section 360bbb-3(b)(1), unless the authorization is terminated  or revoked sooner.       Influenza A by PCR NEGATIVE NEGATIVE Final   Influenza B by PCR NEGATIVE NEGATIVE Final    Comment: (NOTE) The Xpert Xpress SARS-CoV-2/FLU/RSV plus assay is intended as an aid in the diagnosis of influenza from Nasopharyngeal swab specimens and should not be used as a sole basis for treatment. Nasal washings and aspirates are unacceptable for Xpert Xpress SARS-CoV-2/FLU/RSV testing.  Fact Sheet for Patients: EntrepreneurPulse.com.au  Fact Sheet for Healthcare Providers: IncredibleEmployment.be  This test is not yet approved or cleared by the [place] FDA and has been authorized for detection and/or diagnosis of SARS-CoV-2 by FDA under an Emergency Use Authorization (EUA). This EUA will remain in effect (meaning this test can be used) for the duration of the COVID-19 declaration under Section 564(b)(1) of the Act, 21 U.S.C. section 360bbb-3(b)(1), unless the authorization  is terminated or revoked.  Performed at [facility], [address], [place],  [address]  Procedures and diagnostic studies:  CT HEAD WO CONTRAST (5MM)  Result Date: 10/02/2020 CLINICAL DATA:  Dementia, altered level of consciousness, hyperglycemia EXAM: CT HEAD WITHOUT CONTRAST TECHNIQUE: Contiguous axial images were obtained from the base of the skull through the vertex without intravenous contrast. COMPARISON:  09/29/2019 FINDINGS: Brain: No acute infarct or hemorrhage. Stable chronic right basal ganglia lacunar infarct. Lateral ventricles and remaining midline structures are unremarkable. No acute extra-axial fluid collections. No mass effect. Vascular: Extensive atherosclerosis.  No hyperdense vessel. Skull: Normal. Negative for fracture or focal lesion. Sinuses/Orbits: Mild polypoid mucosal thickening within the sphenoid sinuses. Remaining sinuses are clear. Other: None. IMPRESSION: 1. Stable head CT, no acute intracranial process. Electronically Signed   By: Randa Ngo M.D.   On: 10/02/2020 22:57   US Venous Img Lower Unilateral Left (DVT)  Result Date: 10/03/2020 CLINICAL DATA:  87 year old female with edema EXAM: LEFT LOWER EXTREMITY VENOUS DOPPLER ULTRASOUND TECHNIQUE: Gray-scale sonography with graded compression, as well as color Doppler and duplex ultrasound were performed to evaluate the lower extremity deep venous systems from the level of the common femoral vein and including the common femoral, femoral, profunda femoral, popliteal and calf veins including the posterior tibial, peroneal and gastrocnemius veins when visible. The superficial great saphenous vein was also interrogated. Spectral Doppler was utilized to evaluate flow at rest and with distal augmentation maneuvers in the common femoral, femoral and popliteal veins. COMPARISON:  None. FINDINGS: Contralateral Common Femoral Vein: Respiratory phasicity is normal and symmetric with the symptomatic side.  No evidence of thrombus. Normal compressibility. Common Femoral Vein: No evidence of thrombus. Normal compressibility, respiratory phasicity and response to augmentation. Saphenofemoral Junction: No evidence of thrombus. Normal compressibility and flow on color Doppler imaging. Profunda Femoral Vein: No evidence of thrombus. Normal compressibility and flow on color Doppler imaging. Femoral Vein: No evidence of thrombus. Normal compressibility, respiratory phasicity and response to augmentation. Popliteal Vein: No evidence of thrombus. Normal compressibility, respiratory phasicity and response to augmentation. Calf Veins: No evidence of thrombus. Normal compressibility and flow on color Doppler imaging. Superficial Great Saphenous Vein: No evidence of thrombus. Normal compressibility and flow on color Doppler imaging. Other Findings:  None. IMPRESSION: Sonographic survey of the left lower extremity negative for DVT Electronically Signed   By: Corrie Mckusick D.O.   On: 10/03/2020 08:50   DG Chest Portable 1 View  Result Date: 10/02/2020 CLINICAL DATA:  Shortness of breath EXAM: PORTABLE CHEST 1 VIEW COMPARISON:  None. FINDINGS: Normal mediastinum and cardiac silhouette. Mild venous congestion. No evidence of effusion, infiltrate, or pneumothorax. No acute bony abnormality. IMPRESSION: Mild pulmonary venous congestion.  No overt pulmonary edema. Electronically Signed   By: Suzy Bouchard M.D.   On: 10/02/2020 21:10   ECHOCARDIOGRAM COMPLETE  Result Date: 10/03/2020    ECHOCARDIOGRAM REPORT   Patient Name:   SHAKEDRA BEAM Yahnke Date of Exam: 10/03/2020 Medical Rec #:  [id number]    Height:       64.0 in Accession #:    [id number]   Weight:       167.5 lb Date of Birth:  1929-03-06    BSA:          1.815 m Patient Age:    56 years     BP:           179/83 mmHg Patient Gender: F            HR:           69 bpm. Exam  Location:  [facility] Procedure: 2D Echo, Color Doppler and Cardiac Doppler Indications:     R06.00 Dyspnea   History:         Patient has no prior history of Echocardiogram examinations.                  Risk Factors:Hypertension and Diabetes.  Sonographer:     Charmayne Sheer Referring Phys:  [id number] Martelle T TU Diagnosing Phys: Kate Sable MD  Sonographer Comments: Global longitudinal strain was attempted. IMPRESSIONS  1. Left ventricular ejection fraction, by estimation, is 65 to 70%. The left ventricle has normal function. The left ventricle has no regional wall motion abnormalities. Left ventricular diastolic parameters are consistent with Grade II diastolic dysfunction (pseudonormalization).  2. Right ventricular systolic function is normal. The right ventricular size is normal. There is mildly elevated pulmonary artery systolic pressure.  3. Left atrial size was mildly dilated.  4. The mitral valve is grossly normal. Mild mitral valve regurgitation.  5. The aortic valve is tricuspid. Aortic valve regurgitation is mild. Mild to moderate aortic valve sclerosis/calcification is present, without any evidence of aortic stenosis.  6. The inferior vena cava is dilated in size with >50% respiratory variability, suggesting right atrial pressure of 8 mmHg. FINDINGS  Left Ventricle: Left ventricular ejection fraction, by estimation, is 65 to 70%. The left ventricle has normal function. The left ventricle has no regional wall motion abnormalities. Global longitudinal strain performed but not reported based on interpreter judgement due to suboptimal tracking. The left ventricular internal cavity size was normal in size. There is no left ventricular hypertrophy. Left ventricular diastolic parameters are consistent with Grade II diastolic dysfunction (pseudonormalization). Right Ventricle: The right ventricular size is normal. No increase in right ventricular wall thickness. Right ventricular systolic function is normal. There is mildly elevated pulmonary artery systolic pressure. The tricuspid regurgitant velocity is 2.78  m/s,  and with an assumed right atrial pressure of 8 mmHg, the estimated right ventricular systolic pressure is 62.3 mmHg. Left Atrium: Left atrial size was mildly dilated. Right Atrium: Right atrial size was normal in size. Pericardium: There is no evidence of pericardial effusion. Mitral Valve: The mitral valve is grossly normal. Mild mitral valve regurgitation. MV peak gradient, 9.5 mmHg. The mean mitral valve gradient is 4.0 mmHg. Tricuspid Valve: The tricuspid valve is normal in structure. Tricuspid valve regurgitation is mild. Aortic Valve: The aortic valve is tricuspid. Aortic valve regurgitation is mild. Aortic regurgitation PHT measures 346 msec. Mild to moderate aortic valve sclerosis/calcification is present, without any evidence of aortic stenosis. Aortic valve mean gradient measures 7.0 mmHg. Aortic valve peak gradient measures 13.0 mmHg. Aortic valve area, by VTI measures 1.30 cm. Pulmonic Valve: The pulmonic valve was normal in structure. Pulmonic valve regurgitation is mild to moderate. Aorta: The aortic root is normal in size and structure. Venous: The inferior vena cava is dilated in size with greater than 50% respiratory variability, suggesting right atrial pressure of 8 mmHg. IAS/Shunts: No atrial level shunt detected by color flow Doppler.  LEFT VENTRICLE PLAX 2D LVIDd:         4.25 cm  Diastology LVIDs:         2.40 cm  LV e' medial:    5.77 cm/s LV PW:         0.95 cm  LV E/e' medial:  17.9 LV IVS:        0.93 cm  LV e' lateral:   5.33 cm/s LVOT diam:  1.80 cm  LV E/e' lateral: 19.3 LV SV:         69 LV SV Index:   38 LVOT Area:     2.54 cm  RIGHT VENTRICLE RV Basal diam:  3.27 cm RV Mid diam:    3.88 cm RV S prime:     14.10 cm/s LEFT ATRIUM             Index       RIGHT ATRIUM           Index LA diam:        3.50 cm 1.93 cm/m  RA Area:     11.80 cm LA Vol (A2C):   53.7 ml 29.59 ml/m RA Volume:   26.40 ml  14.55 ml/m LA Vol (A4C):   54.8 ml 30.20 ml/m LA Biplane Vol: 57.3 ml 31.58 ml/m   AORTIC VALVE                    PULMONIC VALVE AV Area (Vmax):    1.44 cm     PV Vmax:          1.29 m/s AV Area (Vmean):   1.37 cm     PV Vmean:         101.000 cm/s AV Area (VTI):     1.30 cm     PV VTI:           0.341 m AV Vmax:           180.00 cm/s  PV Peak grad:     6.7 mmHg AV Vmean:          132.000 cm/s PV Mean grad:     4.0 mmHg AV VTI:            0.532 m      PR End Diast Vel: 11.56 msec AV Peak Grad:      13.0 mmHg AV Mean Grad:      7.0 mmHg LVOT Vmax:         102.00 cm/s LVOT Vmean:        71.000 cm/s LVOT VTI:          0.272 m LVOT/AV VTI ratio: 0.51 AI PHT:            346 msec  AORTA Ao Root diam: 3.00 cm MITRAL VALVE                TRICUSPID VALVE MV Area (PHT): 4.36 cm     TR Peak grad:   30.9 mmHg MV Area VTI:   1.86 cm     TR Vmax:        278.00 cm/s MV Peak grad:  9.5 mmHg MV Mean grad:  4.0 mmHg     SHUNTS MV Vmax:       1.54 m/s     Systemic VTI:  0.27 m MV Vmean:      87.1 cm/s    Systemic Diam: 1.80 cm MV Decel Time: 174 msec MV E velocity: 103.00 cm/s MV A velocity: 129.00 cm/s MV E/A ratio:  0.80 Kate Sable MD Electronically signed by Kate Sable MD Signature Date/Time: 10/03/2020/10:45:38 AM    Final                LOS: 0 days   Armando Lauman  [facility]   Pager on [email or web address]. If 7PM-7AM, please contact night-coverage at [email or web address]     10/03/2020, 11:44 AM

## 2020-10-03 NOTE — ED Notes (Signed)
Daughter at bedside visiting with patient and updated on plan of care. Patient transferred from stretcher to a hospital bed, ring found in bed at time of transfer and given to daughter. Breakfast delivered to room. Denies further needs at this time.

## 2020-10-03 NOTE — Progress Notes (Signed)
This RN made aware by daughters at bedside of patient c/o left arm pain x1-2 weeks. Patient has expiratory wheezes as well. MD made aware.

## 2020-10-03 NOTE — ED Notes (Signed)
Fsbs 117

## 2020-10-04 ENCOUNTER — Inpatient Hospital Stay: Payer: Medicare HMO

## 2020-10-04 ENCOUNTER — Encounter: Payer: Self-pay | Admitting: Family Medicine

## 2020-10-04 LAB — RENAL FUNCTION PANEL
Albumin: 3.2 g/dL — ABNORMAL LOW (ref 3.5–5.0)
Anion gap: 10 (ref 5–15)
BUN: 60 mg/dL — ABNORMAL HIGH (ref 8–23)
CO2: 22 mmol/L (ref 22–32)
Calcium: 6.5 mg/dL — ABNORMAL LOW (ref 8.9–10.3)
Chloride: 105 mmol/L (ref 98–111)
Creatinine, Ser: 5.92 mg/dL — ABNORMAL HIGH (ref 0.44–1.00)
GFR, Estimated: 6 mL/min — ABNORMAL LOW (ref >60)
Glucose, Bld: 77 mg/dL (ref 70–99)
Phosphorus: 9 mg/dL — ABNORMAL HIGH (ref 2.5–4.6)
Potassium: 4.4 mmol/L (ref 3.5–5.1)
Sodium: 137 mmol/L (ref 135–145)

## 2020-10-04 LAB — CBC WITH DIFFERENTIAL/PLATELET
Abs Immature Granulocytes: 0.01 10*3/uL (ref 0.00–0.07)
Basophils Absolute: 0.1 10*3/uL (ref 0.0–0.1)
Basophils Relative: 1 %
Eosinophils Absolute: 0.2 10*3/uL (ref 0.0–0.5)
Eosinophils Relative: 3 %
HCT: 22.4 % — ABNORMAL LOW (ref 36.0–46.0)
Hemoglobin: 7.8 g/dL — ABNORMAL LOW (ref 12.0–15.0)
Immature Granulocytes: 0 %
Lymphocytes Relative: 34 %
Lymphs Abs: 2 10*3/uL (ref 0.7–4.0)
MCH: 34.4 pg — ABNORMAL HIGH (ref 26.0–34.0)
MCHC: 34.8 g/dL (ref 30.0–36.0)
MCV: 98.7 fL (ref 80.0–100.0)
Monocytes Absolute: 0.5 10*3/uL (ref 0.1–1.0)
Monocytes Relative: 9 %
Neutro Abs: 3.1 10*3/uL (ref 1.7–7.7)
Neutrophils Relative %: 53 %
Platelets: 191 10*3/uL (ref 150–400)
RBC: 2.27 MIL/uL — ABNORMAL LOW (ref 3.87–5.11)
RDW: 12.6 % (ref 11.5–15.5)
WBC: 6 10*3/uL (ref 4.0–10.5)
nRBC: 0 % (ref 0.0–0.2)

## 2020-10-04 LAB — URINE CULTURE

## 2020-10-04 LAB — GLUCOSE, CAPILLARY: Glucose-Capillary: 113 mg/dL — ABNORMAL HIGH (ref 70–99)

## 2020-10-04 MED ORDER — CALCIUM CARBONATE 1250 (500 CA) MG PO TABS
1.0000 | ORAL_TABLET | Freq: Two times a day (BID) | ORAL | Status: DC
  Administered 2020-10-04 – 2020-10-05 (×2): 500 mg via ORAL
  Filled 2020-10-04 (×4): qty 1

## 2020-10-04 MED ORDER — ALBUTEROL SULFATE (2.5 MG/3ML) 0.083% IN NEBU
3.0000 mL | INHALATION_SOLUTION | Freq: Four times a day (QID) | RESPIRATORY_TRACT | Status: DC | PRN
  Administered 2020-10-04: 3 mL via RESPIRATORY_TRACT
  Filled 2020-10-04: qty 3

## 2020-10-04 MED ORDER — FLUTICASONE FUROATE-VILANTEROL 200-25 MCG/INH IN AEPB
1.0000 | INHALATION_SPRAY | Freq: Every day | RESPIRATORY_TRACT | Status: DC
  Administered 2020-10-04 – 2020-10-05 (×2): 1 via RESPIRATORY_TRACT
  Filled 2020-10-04: qty 28

## 2020-10-04 NOTE — Progress Notes (Addendum)
Progress Note    Kristi Brown  OMV:[id number] DOB: 20-Dec-1929  DOA: 10/02/2020 PCP: Danae Orleans, MD      Brief Narrative:    Medical records reviewed and are as summarized below:  Kristi Brown is a 87 y.o. female  with medical history significant for dementia, hypertension, type 2 diabetes with retinopathy, CKD stage V not on dialysis, hypothyroidism, asthma, TIA, carcinoid tumor, history of ITP and GERD, who was brought to the hospital because of shortness of breath, diaphoresis, weakness in the extremities, lower extremity edema and slurred speech.  Reportedly, EMS discovered that her blood sugar was 58.  Blood glucose was as low as 49 in the emergency room.  She takes glipizide, according to her daughters, for diabetes.   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  Acute metabolic encephalopathy with underlying dementia: Mental status appears to be at baseline.  No acute abnormality on CT head.  Continue Aricept and Seroquel.  Hypoglycemia: Resolved.  Hemoglobin A1c is 5.  [name], daughter, was advised not to continue with metformin and glipizide at home.    Abnormal urinalysis: Urine culture showed multiple species.  Doubt UTI at this time.  Discontinue IV Rocephin.   AKI on CKD stage V: Creatinine is down from 6.63-5.92 (not significant.  Creatinine was 5.49 on 06/26/2020.  Creatinine was 3.69 about a year ago.  Family is not interested in hemodialysis.  IV fluids was discontinued yesterday  because of concern for fluid overload.  Hypocalcemia, hyperphosphatemia: Calcium level is 6.5 and phosphorus level is 9.  Start calcium carbonate as a phosphate binder.  Check PTH level.  Vitamin D level was normal.  Chronic diastolic CHF: Repeat chest x-ray on 10/04/2020 did not show any acute abnormality. 2D echo showed EF estimated at 65 to 62%, grade 2 diastolic dysfunction, mildly elevated pulmonary artery systolic pressure, mild MR, mild AR.  Hypertension: Continue antihypertensives  Mild lower extremity edema: Venous duplex did not show any evidence of DVT  Other comorbidities include history of anemia of chronic kidney disease, stroke, hypothyroidism, neuroendocrine/carcinoid tumor s/p resection in 2017.  Discussed goals of care with [name] (daughter) at the bedside.  Diagnoses and prognosis were discussed.  She will hold discussions with the rest of her family and make a decision about CODE STATUS.   Diet Order             Diet Heart Room service appropriate? Yes; Fluid consistency: Thin  Diet effective now                      Consultants: None  Procedures: None    Medications:    amLODipine  10 mg Oral Daily   aspirin EC  81 mg Oral Daily   carvedilol  25 mg Oral BID WC   donepezil  10 mg Oral QHS   fluticasone furoate-vilanterol  1 puff Inhalation Daily   heparin injection (subcutaneous)  5,000 Units Subcutaneous Q8H  hydrochlorothiazide  12.5 mg Oral Daily   levothyroxine  25 mcg Oral q morning   losartan  12.5 mg Oral Daily   pantoprazole  40 mg Oral Daily   QUEtiapine  25 mg Oral QHS   Continuous Infusions:  cefTRIAXone (ROCEPHIN)  IV       Anti-infectives (From admission, onward)    Start     Dose/Rate Route Frequency Ordered Stop   10/04/20 2200  cefTRIAXone (ROCEPHIN) 1 g in sodium chloride 0.9 % 100 mL IVPB        1 g 200 mL/hr over 30 Minutes Intravenous Every 24 hours 10/03/20 0009     10/02/20 2215  cefTRIAXone (ROCEPHIN) 1 g in  sodium chloride 0.9 % 100 mL IVPB        1 g 200 mL/hr over 30 Minutes Intravenous  Once 10/02/20 2210 10/02/20 2306              Family Communication/Anticipated D/C date and plan/Code Status   DVT prophylaxis: heparin injection 5,000 Units Start: 10/03/20 0015     Code Status: Full Code  Family Communication: Butch Penny and [name] Disposition Plan:    Status is: Observation  The patient will require care spanning > 2 midnights and should be moved to inpatient because: IV treatments appropriate due to intensity of illness or inability to take PO and Inpatient level of care appropriate due to severity of illness  Dispo: The patient is from: Home              Anticipated d/c is to: Home              Patient currently is not medically stable to d/c.   Difficult to place patient No           Subjective:   Interval events noted.  [name], daughter, was at the bedside.  She reported that patient had been wheezing last night.  No shortness of breath or chest pain.  Objective:    Vitals:   10/04/20 0600 10/04/20 0800 10/04/20 1000 10/04/20 1030  BP: (!) 161/56 (!) 174/54 (!) 163/48   Pulse: 64 65 66 62  Resp: 15 18 (!) 22 14  Temp:      TempSrc:      SpO2: 95% 93% 96% 91%  Weight:      Height:       No data found.   Intake/Output Summary (Last 24 hours) at 10/04/2020 1441 Last data filed at 10/04/2020 0700 Gross per 24 hour  Intake --  Output 400 ml  Net -400 ml   Filed Weights   10/02/20 2355  Weight: 76 kg    Exam:  GEN: NAD SKIN: Warm and dry EYES: No pallor or icterus ENT: MMM CV: RRR PULM: No wheezing or rales heard ABD: soft, ND, NT, +BS CNS: Drowsy but arousable, non focal EXT: No edema or tenderness         Data Reviewed:   I have personally reviewed following labs and imaging studies:  Labs: Labs show the following:   Basic Metabolic Panel: Recent Labs  Lab 10/02/20 2038 10/03/20 0511 10/04/20 0623  NA 135 137 137   K 4.7 4.5 4.4  CL 101 104 105  CO2 19* 21* 22  GLUCOSE 63* 65* 77  BUN 64* 64* 60*  CREATININE 6.45* 6.63* 5.92*  CALCIUM 6.7* 6.4* 6.5*  PHOS  --  9.1* 9.0*   GFR Estimated Creatinine Clearance: 6.4 mL/min (A) (by C-G formula based on SCr  of 5.92 mg/dL (H)). Liver Function Tests: Recent Labs  Lab 10/02/20 2038 10/03/20 0511 10/04/20 0623  AST 16  --   --   ALT 11  --   --   ALKPHOS 98  --   --   BILITOT 0.9  --   --   PROT 7.1  --   --   ALBUMIN 3.8 3.3* 3.2*   No results for input(s): LIPASE, AMYLASE in the last 168 hours. No results for input(s): AMMONIA in the last 168 hours. Coagulation profile No results for input(s): INR, PROTIME in the last 168 hours.  CBC: Recent Labs  Lab 10/02/20 2038 10/04/20 0623  WBC 7.9 6.0  NEUTROABS 5.1 3.1  HGB 8.4* 7.8*  HCT 25.3* 22.4*  MCV 98.4 98.7  PLT 215 191   Cardiac Enzymes: No results for input(s): CKTOTAL, CKMB, CKMBINDEX, TROPONINI in the last 168 hours. BNP (last 3 results) No results for input(s): PROBNP in the last 8760 hours. CBG: Recent Labs  Lab 10/03/20 0700 10/03/20 0815 10/03/20 1045 10/03/20 1234 10/03/20 1429  GLUCAP 77 86 103* 123* 143*   D-Dimer: No results for input(s): DDIMER in the last 72 hours. Hgb A1c: Recent Labs    10/03/20 0511  HGBA1C 5.0   Lipid Profile: No results for input(s): CHOL, HDL, LDLCALC, TRIG, CHOLHDL, LDLDIRECT in the last 72 hours. Thyroid function studies: No results for input(s): TSH, T4TOTAL, T3FREE, THYROIDAB in the last 72 hours.  Invalid input(s): FREET3 Anemia work up: No results for input(s): VITAMINB12, FOLATE, FERRITIN, TIBC, IRON, RETICCTPCT in the last 72 hours. Sepsis Labs: Recent Labs  Lab 10/02/20 2038 10/04/20 0623  WBC 7.9 6.0    Microbiology Recent Results (from the past 240 hour(s))  Urine Culture     Status: Abnormal   Collection Time: 10/02/20  9:29 PM   Specimen: Urine, Random  Result Value Ref Range Status   Specimen  Description   Final    URINE, RANDOM Performed at [facility], [address], [place], [place] [address]    Special Requests   Final    NONE Performed at [facility], [address] [place], [place] [address]    Culture MULTIPLE SPECIES PRESENT, SUGGEST RECOLLECTION (A)  Final   Report Status 10/04/2020 FINAL  Final  Resp Panel by RT-PCR (Flu A&B, Covid) Nasopharyngeal Swab     Status: None   Collection Time: 10/02/20 11:11 PM   Specimen: Nasopharyngeal Swab; Nasopharyngeal(NP) swabs in vial transport medium  Result Value Ref Range Status   SARS Coronavirus 2 by RT PCR NEGATIVE NEGATIVE Final    Comment: (NOTE) SARS-CoV-2 target nucleic acids are NOT DETECTED.  The SARS-CoV-2 RNA is generally detectable in upper respiratory specimens during the acute phase of infection. The lowest concentration of SARS-CoV-2 viral copies this assay can detect is 138 copies/mL. A negative result does not preclude SARS-Cov-2 infection and should not be used as the sole basis for treatment or other patient management decisions. A negative result may occur with  improper specimen collection/handling, submission of specimen other than nasopharyngeal swab, presence of viral mutation(s) within the areas targeted by this assay, and inadequate number of viral copies(<138 copies/mL). A negative result must be combined with clinical observations, patient history, and epidemiological information. The expected result is Negative.  Fact Sheet for Patients:  EntrepreneurPulse.com.au  Fact Sheet for Healthcare Providers:  IncredibleEmployment.be  This test is no t yet approved or cleared by the [place] FDA and  has been  authorized for detection and/or diagnosis of SARS-CoV-2 by FDA under an Emergency Use Authorization (EUA). This EUA will remain  in effect (meaning this test can be used) for the duration of the COVID-19 declaration under  Section 564(b)(1) of the Act, 21 U.S.C.section 360bbb-3(b)(1), unless the authorization is terminated  or revoked sooner.       Influenza A by PCR NEGATIVE NEGATIVE Final   Influenza B by PCR NEGATIVE NEGATIVE Final    Comment: (NOTE) The Xpert Xpress SARS-CoV-2/FLU/RSV plus assay is intended as an aid in the diagnosis of influenza from Nasopharyngeal swab specimens and should not be used as a sole basis for treatment. Nasal washings and aspirates are unacceptable for Xpert Xpress SARS-CoV-2/FLU/RSV testing.  Fact Sheet for Patients: EntrepreneurPulse.com.au  Fact Sheet for Healthcare Providers: IncredibleEmployment.be  This test is not yet approved or cleared by the [place] FDA and has been authorized for detection and/or diagnosis of SARS-CoV-2 by FDA under an Emergency Use Authorization (EUA). This EUA will remain in effect (meaning this test can be used) for the duration of the COVID-19 declaration under Section 564(b)(1) of the Act, 21 U.S.C. section 360bbb-3(b)(1), unless the authorization is terminated or revoked.  Performed at [facility], [address] [place], [place] [address]     Procedures and diagnostic studies:  CT HEAD WO CONTRAST (5MM)  Result Date: 10/02/2020 CLINICAL DATA:  Dementia, altered level of consciousness, hyperglycemia EXAM: CT HEAD WITHOUT CONTRAST TECHNIQUE: Contiguous axial images were obtained from the base of the skull through the vertex without intravenous contrast. COMPARISON:  09/29/2019 FINDINGS: Brain: No acute infarct or hemorrhage. Stable chronic right basal ganglia lacunar infarct. Lateral ventricles and remaining midline structures are unremarkable. No acute extra-axial fluid collections. No mass effect. Vascular: Extensive atherosclerosis.  No hyperdense vessel. Skull: Normal. Negative for fracture or focal lesion. Sinuses/Orbits: Mild polypoid mucosal thickening within the sphenoid  sinuses. Remaining sinuses are clear. Other: None. IMPRESSION: 1. Stable head CT, no acute intracranial process. Electronically Signed   By: Randa Ngo M.D.   On: 10/02/2020 22:57   US Venous Img Lower Unilateral Left (DVT)  Result Date: 10/03/2020 CLINICAL DATA:  87 year old female with edema EXAM: LEFT LOWER EXTREMITY VENOUS DOPPLER ULTRASOUND TECHNIQUE: Gray-scale sonography with graded compression, as well as color Doppler and duplex ultrasound were performed to evaluate the lower extremity deep venous systems from the level of the common femoral vein and including the common femoral, femoral, profunda femoral, popliteal and calf veins including the posterior tibial, peroneal and gastrocnemius veins when visible. The superficial great saphenous vein was also interrogated. Spectral Doppler was utilized to evaluate flow at rest and with distal augmentation maneuvers in the common femoral, femoral and popliteal veins. COMPARISON:  None. FINDINGS: Contralateral Common Femoral Vein: Respiratory phasicity is normal and symmetric with the symptomatic side. No evidence of thrombus. Normal compressibility. Common Femoral Vein: No evidence of thrombus. Normal compressibility, respiratory phasicity and response to augmentation. Saphenofemoral Junction: No evidence of thrombus. Normal compressibility and flow on color Doppler imaging. Profunda Femoral Vein: No evidence of thrombus. Normal compressibility and flow on color Doppler imaging. Femoral Vein: No evidence of thrombus. Normal compressibility, respiratory phasicity and response to augmentation. Popliteal Vein: No evidence of thrombus. Normal compressibility, respiratory phasicity and response to augmentation. Calf Veins: No evidence of thrombus. Normal compressibility and flow on color Doppler imaging. Superficial Great Saphenous Vein: No evidence of thrombus. Normal compressibility and flow on color Doppler imaging. Other Findings:  None. IMPRESSION:  Sonographic survey of  the left lower extremity negative for DVT Electronically Signed   By: Corrie Mckusick D.O.   On: 10/03/2020 08:50   DG Chest Port 1 View  Result Date: 10/04/2020 CLINICAL DATA:  Altered mental status EXAM: PORTABLE CHEST 1 VIEW COMPARISON:  10/02/2020 FINDINGS: Heart is borderline in size. Aortic atherosclerosis. No confluent airspace opacities or effusions. No acute bony abnormality. IMPRESSION: No active disease. Electronically Signed   By: Rolm Baptise M.D.   On: 10/04/2020 10:59   DG Chest Portable 1 View  Result Date: 10/02/2020 CLINICAL DATA:  Shortness of breath EXAM: PORTABLE CHEST 1 VIEW COMPARISON:  None. FINDINGS: Normal mediastinum and cardiac silhouette. Mild venous congestion. No evidence of effusion, infiltrate, or pneumothorax. No acute bony abnormality. IMPRESSION: Mild pulmonary venous congestion.  No overt pulmonary edema. Electronically Signed   By: Suzy Bouchard M.D.   On: 10/02/2020 21:10   ECHOCARDIOGRAM COMPLETE  Result Date: 10/03/2020    ECHOCARDIOGRAM REPORT   Patient Name:   ANUJA MANKA Dusek Date of Exam: 10/03/2020 Medical Rec #:  [id number]    Height:       64.0 in Accession #:    [id number]   Weight:       167.5 lb Date of Birth:  09-Sep-1929    BSA:          1.815 m Patient Age:    62 years     BP:           179/83 mmHg Patient Gender: F            HR:           69 bpm. Exam Location:  [facility] Procedure: 2D Echo, Color Doppler and Cardiac Doppler Indications:     R06.00 Dyspnea  History:         Patient has no prior history of Echocardiogram examinations.                  Risk Factors:Hypertension and Diabetes.  Sonographer:     Charmayne Sheer Referring Phys:  [id number] [place] T TU Diagnosing Phys: Kate Sable MD  Sonographer Comments: Global longitudinal strain was attempted. IMPRESSIONS  1. Left ventricular ejection fraction, by estimation, is 65 to 70%. The left ventricle has normal function. The left ventricle has no regional wall motion abnormalities. Left  ventricular diastolic parameters are consistent with Grade II diastolic dysfunction (pseudonormalization).  2. Right ventricular systolic function is normal. The right ventricular size is normal. There is mildly elevated pulmonary artery systolic pressure.  3. Left atrial size was mildly dilated.  4. The mitral valve is grossly normal. Mild mitral valve regurgitation.  5. The aortic valve is tricuspid. Aortic valve regurgitation is mild. Mild to moderate aortic valve sclerosis/calcification is present, without any evidence of aortic stenosis.  6. The inferior vena cava is dilated in size with >50% respiratory variability, suggesting right atrial pressure of 8 mmHg. FINDINGS  Left Ventricle: Left ventricular ejection fraction, by estimation, is 65 to 70%. The left ventricle has normal function. The left ventricle has no regional wall motion abnormalities. Global longitudinal strain performed but not reported based on interpreter judgement due to suboptimal tracking. The left ventricular internal cavity size was normal in size. There is no left ventricular hypertrophy. Left ventricular diastolic parameters are consistent with Grade II diastolic dysfunction (pseudonormalization). Right Ventricle: The right ventricular size is normal. No increase in right ventricular wall thickness. Right ventricular systolic function is normal. There is mildly elevated pulmonary artery systolic pressure. The  tricuspid regurgitant velocity is 2.78  m/s, and with an assumed right atrial pressure of 8 mmHg, the estimated right ventricular systolic pressure is 60.1 mmHg. Left Atrium: Left atrial size was mildly dilated. Right Atrium: Right atrial size was normal in size. Pericardium: There is no evidence of pericardial effusion. Mitral Valve: The mitral valve is grossly normal. Mild mitral valve regurgitation. MV peak gradient, 9.5 mmHg. The mean mitral valve gradient is 4.0 mmHg. Tricuspid Valve: The tricuspid valve is normal in  structure. Tricuspid valve regurgitation is mild. Aortic Valve: The aortic valve is tricuspid. Aortic valve regurgitation is mild. Aortic regurgitation PHT measures 346 msec. Mild to moderate aortic valve sclerosis/calcification is present, without any evidence of aortic stenosis. Aortic valve mean gradient measures 7.0 mmHg. Aortic valve peak gradient measures 13.0 mmHg. Aortic valve area, by VTI measures 1.30 cm. Pulmonic Valve: The pulmonic valve was normal in structure. Pulmonic valve regurgitation is mild to moderate. Aorta: The aortic root is normal in size and structure. Venous: The inferior vena cava is dilated in size with greater than 50% respiratory variability, suggesting right atrial pressure of 8 mmHg. IAS/Shunts: No atrial level shunt detected by color flow Doppler.  LEFT VENTRICLE PLAX 2D LVIDd:         4.25 cm  Diastology LVIDs:         2.40 cm  LV e' medial:    5.77 cm/s LV PW:         0.95 cm  LV E/e' medial:  17.9 LV IVS:        0.93 cm  LV e' lateral:   5.33 cm/s LVOT diam:     1.80 cm  LV E/e' lateral: 19.3 LV SV:         69 LV SV Index:   38 LVOT Area:     2.54 cm  RIGHT VENTRICLE RV Basal diam:  3.27 cm RV Mid diam:    3.88 cm RV S prime:     14.10 cm/s LEFT ATRIUM             Index       RIGHT ATRIUM           Index LA diam:        3.50 cm 1.93 cm/m  RA Area:     11.80 cm LA Vol (A2C):   53.7 ml 29.59 ml/m RA Volume:   26.40 ml  14.55 ml/m LA Vol (A4C):   54.8 ml 30.20 ml/m LA Biplane Vol: 57.3 ml 31.58 ml/m  AORTIC VALVE                    PULMONIC VALVE AV Area (Vmax):    1.44 cm     PV Vmax:          1.29 m/s AV Area (Vmean):   1.37 cm     PV Vmean:         101.000 cm/s AV Area (VTI):     1.30 cm     PV VTI:           0.341 m AV Vmax:           180.00 cm/s  PV Peak grad:     6.7 mmHg AV Vmean:          132.000 cm/s PV Mean grad:     4.0 mmHg AV VTI:            0.532 m      PR End Diast  Vel: 11.56 msec AV Peak Grad:      13.0 mmHg AV Mean Grad:      7.0 mmHg LVOT Vmax:          102.00 cm/s LVOT Vmean:        71.000 cm/s LVOT VTI:          0.272 m LVOT/AV VTI ratio: 0.51 AI PHT:            346 msec  AORTA Ao Root diam: 3.00 cm MITRAL VALVE                TRICUSPID VALVE MV Area (PHT): 4.36 cm     TR Peak grad:   30.9 mmHg MV Area VTI:   1.86 cm     TR Vmax:        278.00 cm/s MV Peak grad:  9.5 mmHg MV Mean grad:  4.0 mmHg     SHUNTS MV Vmax:       1.54 m/s     Systemic VTI:  0.27 m MV Vmean:      87.1 cm/s    Systemic Diam: 1.80 cm MV Decel Time: 174 msec MV E velocity: 103.00 cm/s MV A velocity: 129.00 cm/s MV E/A ratio:  0.80 Kate Sable MD Electronically signed by Kate Sable MD Signature Date/Time: 10/03/2020/10:45:38 AM    Final                LOS: 1 day   Bransen Fassnacht  [facility]   Pager on [email or web address]. If 7PM-7AM, please contact night-coverage at [email or web address]     10/04/2020, 2:41 PM

## 2020-10-04 NOTE — ED Notes (Signed)
Patient is resting comfortably. Purewick changed. Daughters at bedside currently. Expiratory wheezes noted, requesting home inhalers, MD made aware.

## 2020-10-05 LAB — CBC WITH DIFFERENTIAL/PLATELET
Abs Immature Granulocytes: 0.01 10*3/uL (ref 0.00–0.07)
Basophils Absolute: 0.1 10*3/uL (ref 0.0–0.1)
Basophils Relative: 1 %
Eosinophils Absolute: 0.3 10*3/uL (ref 0.0–0.5)
Eosinophils Relative: 5 %
HCT: 22.6 % — ABNORMAL LOW (ref 36.0–46.0)
Hemoglobin: 7.5 g/dL — ABNORMAL LOW (ref 12.0–15.0)
Immature Granulocytes: 0 %
Lymphocytes Relative: 34 %
Lymphs Abs: 2.1 10*3/uL (ref 0.7–4.0)
MCH: 32.6 pg (ref 26.0–34.0)
MCHC: 33.2 g/dL (ref 30.0–36.0)
MCV: 98.3 fL (ref 80.0–100.0)
Monocytes Absolute: 0.5 10*3/uL (ref 0.1–1.0)
Monocytes Relative: 9 %
Neutro Abs: 3.1 10*3/uL (ref 1.7–7.7)
Neutrophils Relative %: 51 %
Platelets: 189 10*3/uL (ref 150–400)
RBC: 2.3 MIL/uL — ABNORMAL LOW (ref 3.87–5.11)
RDW: 12.5 % (ref 11.5–15.5)
WBC: 6 10*3/uL (ref 4.0–10.5)
nRBC: 0 % (ref 0.0–0.2)

## 2020-10-05 LAB — RENAL FUNCTION PANEL
Albumin: 3.2 g/dL — ABNORMAL LOW (ref 3.5–5.0)
Anion gap: 9 (ref 5–15)
BUN: 63 mg/dL — ABNORMAL HIGH (ref 8–23)
CO2: 22 mmol/L (ref 22–32)
Calcium: 6.5 mg/dL — ABNORMAL LOW (ref 8.9–10.3)
Chloride: 104 mmol/L (ref 98–111)
Creatinine, Ser: 6.13 mg/dL — ABNORMAL HIGH (ref 0.44–1.00)
GFR, Estimated: 6 mL/min — ABNORMAL LOW (ref >60)
Glucose, Bld: 64 mg/dL — ABNORMAL LOW (ref 70–99)
Phosphorus: 8.5 mg/dL — ABNORMAL HIGH (ref 2.5–4.6)
Potassium: 4.2 mmol/L (ref 3.5–5.1)
Sodium: 135 mmol/L (ref 135–145)

## 2020-10-05 LAB — PARATHYROID HORMONE, INTACT (NO CA): PTH: 194 pg/mL — ABNORMAL HIGH (ref 15–65)

## 2020-10-05 LAB — IRON AND TIBC
Iron: 63 ug/dL (ref 28–170)
Saturation Ratios: 25 % (ref 10.4–31.8)
TIBC: 252 ug/dL (ref 250–450)
UIBC: 189 ug/dL

## 2020-10-05 LAB — GLUCOSE, CAPILLARY
Glucose-Capillary: 69 mg/dL — ABNORMAL LOW (ref 70–99)
Glucose-Capillary: 88 mg/dL (ref 70–99)
Glucose-Capillary: 130 mg/dL — ABNORMAL HIGH (ref 70–99)

## 2020-10-05 LAB — FERRITIN: Ferritin: 194 ng/mL (ref 11–307)

## 2020-10-05 LAB — VITAMIN B12: Vitamin B-12: 132 pg/mL — ABNORMAL LOW (ref 180–914)

## 2020-10-05 MED ORDER — GUAIFENESIN 100 MG/5ML PO SOLN
5.0000 mL | Freq: Once | ORAL | Status: AC
  Administered 2020-10-05: 100 mg via ORAL
  Filled 2020-10-05: qty 5

## 2020-10-05 MED ORDER — NEPRO/CARBSTEADY PO LIQD
237.0000 mL | Freq: Two times a day (BID) | ORAL | Status: DC
  Administered 2020-10-05: 237 mL via ORAL

## 2020-10-05 MED ORDER — CALCIUM CARBONATE 1250 (500 CA) MG PO TABS: 1.0000 | ORAL_TABLET | Freq: Two times a day (BID) | ORAL | Status: DC

## 2020-10-05 MED ORDER — ENSURE ENLIVE PO LIQD: 237.0000 mL | Freq: Two times a day (BID) | ORAL | Status: DC

## 2020-10-05 MED ORDER — SORBITOL 70 % SOLN
960.0000 mL | TOPICAL_OIL | Freq: Once | ORAL | Status: AC
  Administered 2020-10-05: 960 mL via RECTAL
  Filled 2020-10-05: qty 473

## 2020-10-05 NOTE — Progress Notes (Signed)
Initial Nutrition Assessment  DOCUMENTATION CODES:  Not applicable  INTERVENTION:  Liberalize diet to regular to promote oral intake Nepro Shake po BID, each supplement provides 425 kcal and 19 grams protein  NUTRITION DIAGNOSIS:  Inadequate oral intake related to decreased appetite as evidenced by per patient/family report.  GOAL:  Patient will meet greater than or equal to 90% of their needs  MONITOR:  PO intake, Supplement acceptance, Labs  REASON FOR ASSESSMENT:  Malnutrition Screening Tool    ASSESSMENT:  87 y.o. female with hx of DM, HTN, thyroid dx, disease, HLD, CKD5 not on HD, osteopenia, malignant carcinoid tumor of duodenum, and GERD presented to the ED from home with SOB and AMS.   Found to be hypoglycemic on EMS arrival. Workup in ED concerning for UTI and AKI  Pt resting in bedside chair at the time of assessment. Daughter at bedside. Appetite has been poor this admission and pt has not had a BM - per daughter this is not pt's normal. Diet was restrictive, liberalized and will add ensure. Daughter reports that pt does like ensure products, prefers vanilla. Will add Nepro due to high phosphorus levels.    Mild muscle deficits seen on exam - most consistent with normal aging. Daughter reports weight loss, no recent weight hx available to confirm.  Nutritionally Relevant Medications: Scheduled Meds:  calcium carbonate  1 tablet Oral BID WC   hydrochlorothiazide  12.5 mg Oral Daily   pantoprazole  40 mg Oral Daily   Labs Reviewed: BUN 63, creatinine 6.13 Calcium 6.77 (corrected for low albumin) Phosphorus 8.5  NUTRITION - FOCUSED PHYSICAL EXAM: Flowsheet Row Most Recent Value  Orbital Region No depletion  Upper Arm Region No depletion  Thoracic and Lumbar Region No depletion  Buccal Region No depletion  Temple Region Mild depletion  Clavicle Bone Region No depletion  Clavicle and Acromion Bone Region No depletion  Scapular Bone Region No depletion  Dorsal  Hand Mild depletion  Patellar Region No depletion  Anterior Thigh Region No depletion  Posterior Calf Region No depletion  Edema (RD Assessment) Mild  Hair Reviewed  Eyes Reviewed  Mouth Reviewed  Skin Reviewed  Nails Reviewed   Diet Order:   Diet Order             Diet regular Room service appropriate? Yes; Fluid consistency: Thin  Diet effective now           Diet - low sodium heart healthy           Diet renal 60/70-02-21-1198                   EDUCATION NEEDS:  No education needs have been identified at this time  Skin:  Skin Assessment: Reviewed RN Assessment  Last BM:  9/13  Height:  Ht Readings from Last 1 Encounters:  10/02/20 5\' 4"  (1.626 m)    Weight:  Wt Readings from Last 1 Encounters:  10/02/20 76 kg    Ideal Body Weight:  54.5 kg  BMI:  Body mass index is 28.76 kg/m.  Estimated Nutritional Needs:  Kcal:  1600-1800 kcal/d Protein:  80-90g/d Fluid:  1.6-1.8 L/d   Ranell Patrick, RD, LDN Clinical Dietitian Pager on [place]

## 2020-10-05 NOTE — TOC Progression Note (Signed)
Transition of Care [facility]) - Progression Note    Patient Details  Name: Kristi Brown MRN: [id number] Date of Birth: [birth year]  Transition of Care [facility]) CM/SW [place], RN Phone Number: 10/05/2020, 3:13 PM  Clinical Narrative:    Met with the patient and the family in the room  She needs a 3 in 1, [employer] will deliver, she has a RW at home, the daughter provides transportation, Nanine Means [facility] was given the referral for PT, They will plan to do [place] on Monday, she can afford her medications no additional needs        Expected Discharge Plan and Services           Expected Discharge Date: 10/05/20                                     Social Determinants of Health (SDOH) Interventions    Readmission Risk Interventions No flowsheet data found.

## 2020-10-05 NOTE — Care Management Important Message (Signed)
Important Message  Patient Details  Name: Kristi Brown MRN: [id number] Date of Birth: 10-13-1929   Medicare Important Message Given:  Yes     [name] 10/05/2020, 1:26 PM

## 2020-10-05 NOTE — Evaluation (Signed)
Physical Therapy Evaluation Patient Details Name: Kristi Brown MRN: [id number] DOB: Dec 09, 1929 Today's Date: 10/05/2020  History of Present Illness  Pt is an 87 y.o. female presenting to hospital 9/13 with SOB and AMS; also some slurred speech noted; also diaphoresis and weakness in extremities.  Pt admitted with acute metabolic encephalopathy with baseline dementia, hypoglycemia, UTI, AKI on CKD stage V, LE edema (L worse than R, (-) for DVT).  PMH includes DM, htn, dementia, TIA, CKD, syncope, carcinoid tumor.  Clinical Impression  Prior to hospital admission, pt was ambulatory (no AD) and lives alone but has 24/7 assist between pt's 2 daughters (and also has friend that comes during the day to assist).  Currently pt is SBA semi-supine to sitting edge of bed; CGA with transfers using RW; and initially CGA to min assist to ambulate 20 feet with RW but with sitting rest break able to progress to ambulating 60 feet with RW CGA (improved balance and mobility and overall strength noted with continued mobility during session).  Pt requiring use of RW for balance during sessions activities.  Pt's daughter reports pt typically does not walk more than about 40 feet at a time d/t SOB.  Pt's O2 sats 89% on room air at rest (nurse placed pt on 2 L O2 via nasal cannula and pt's O2 sats 91% or greater on 2 L via nasal cannula rest of therapy session).  Pt would benefit from skilled PT to address noted impairments and functional limitations (see below for any additional details).  Upon hospital discharge, pt would benefit from [facility] and 24/7 assist (pt's daughter reports pt has 24/7 assist and pt's son can also assist pt into home).       Recommendations for follow up therapy are one component of a multi-disciplinary discharge planning process, led by the attending physician.  Recommendations may be updated based on patient status, additional functional criteria and insurance authorization.  Follow Up Recommendations  Home health PT;Supervision/Assistance - 24 hour    Equipment Recommendations  Rolling walker with 5" wheels;3in1 (PT)    Recommendations for Other Services       Precautions / Restrictions Precautions Precautions: Fall Restrictions Weight Bearing Restrictions: No      Mobility  Bed Mobility Overal bed mobility: Needs Assistance Bed Mobility: Supine to Sit     Supine to sit: Supervision;HOB elevated     General bed mobility comments: increased effort/time to perform on own    Transfers Overall transfer level: Needs assistance Equipment used: Rolling walker (2 wheeled) Transfers: Sit to/from Stand Sit to Stand: Min guard         General transfer comment: x3 trials from bed; x2 trials from recliner; vc's for UE placement  Ambulation/Gait Ambulation/Gait assistance: Min guard;Min assist Gait Distance (Feet):  (20 feet; 60 feet) Assistive device: Rolling walker (2 wheeled)   Gait velocity: decreased   General Gait Details: mildly unsteady x20 feet with RW (CGA to min assist for balance) and CGA to ambulate 60 feet with RW; vc's to stay closer to [employer]    Modified Rankin (Stroke Patients Only)       Balance Overall balance assessment: Needs assistance Sitting-balance support: No upper extremity supported;Feet supported Sitting balance-Leahy Scale: Good Sitting balance - Comments: steady sitting reaching within BOS   Standing balance support: Single extremity supported Standing balance-Leahy Scale: Fair Standing balance comment: steady standing with at least single UE  support                             Pertinent Vitals/Pain Pain Assessment:  (brief pain in B calves when standing but improved with mobility).  HR WFL during sessions activities.    Home Living Family/patient expects to be discharged to:: Private residence Living Arrangements: Children Available Help at Discharge: Family;Available 24  hours/day;Friend(s) Type of Home: House Home Access: Stairs to enter Entrance Stairs-Rails: Right Entrance Stairs-Number of Steps: 2 Home Layout: One level Home Equipment: Grab bars - toilet;Grab bars - tub/shower;Shower seat;Cane - single point      Prior Function Level of Independence: Needs assistance         Comments: No recent falls.  Has 24/7 assist.     Hand Dominance        Extremity/Trunk Assessment   Upper Extremity Assessment Upper Extremity Assessment: Generalized weakness    Lower Extremity Assessment Lower Extremity Assessment: Generalized weakness    Cervical / Trunk Assessment Cervical / Trunk Assessment: Other exceptions Cervical / Trunk Exceptions: forward head/shoulders  Communication   Communication: HOH  Cognition Arousal/Alertness: Awake/alert Behavior During Therapy: WFL for tasks assessed/performed Overall Cognitive Status: History of cognitive impairments - at baseline                                 General Comments: Oriented to self only (name and month/day of birthday)      General Comments  Nursing cleared pt for participation in physical therapy.  Pt agreeable to PT session.  Pt's daughter present most of session.    Exercises   Transfer and gait training with RW   Assessment/Plan    PT Assessment Patient needs continued PT services  PT Problem List Decreased strength;Decreased activity tolerance;Decreased balance;Decreased mobility;Decreased knowledge of use of DME;Decreased knowledge of precautions       PT Treatment Interventions DME instruction;Gait training;Stair training;Functional mobility training;Therapeutic activities;Therapeutic exercise;Balance training;Patient/family education    PT Goals (Current goals can be found in the Care Plan section)  Acute Rehab PT Goals Patient Stated Goal: to improve mobility and strength PT Goal Formulation: With patient/family Time For Goal Achievement:  10/19/20 Potential to Achieve Goals: Good    Frequency Min 2X/week   Barriers to discharge        Co-evaluation               AM-PAC PT "6 Clicks" Mobility  Outcome Measure Help needed turning from your back to your side while in a flat bed without using bedrails?: None Help needed moving from lying on your back to sitting on the side of a flat bed without using bedrails?: A Little Help needed moving to and from a bed to a chair (including a wheelchair)?: A Little Help needed standing up from a chair using your arms (e.g., wheelchair or bedside chair)?: A Little Help needed to walk in hospital room?: A Little Help needed climbing 3-5 steps with a railing? : A Little 6 Click Score: 19    End of Session Equipment Utilized During Treatment: Gait belt Activity Tolerance: Patient limited by fatigue Patient left: in chair;with call bell/phone within reach;with chair alarm set;with family/visitor present Nurse Communication: Precautions;Other (comment);Mobility status (pt's O2 status) PT Visit Diagnosis: Unsteadiness on feet (R26.81);Other abnormalities of gait and mobility (R26.89);Muscle weakness (generalized) (M62.81)    Time: 0626-9485 PT Time Calculation (min) (ACUTE  ONLY): 51 min   Charges:   PT Evaluation $PT Eval Low Complexity: 1 Low PT Treatments $Gait Training: 8-22 mins $Therapeutic Activity: 23-37 mins       Maliea Grandmaison, PT 10/05/20, 1:15 PM

## 2020-10-05 NOTE — Progress Notes (Signed)
I concur with Braden Scale Score Dorris Fetch BSN, RN Nursing Instructor [facility]

## 2020-10-05 NOTE — Discharge Summary (Signed)
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  Diet recommendation:  Diet Order             Diet regular Room service appropriate? Yes; Fluid consistency: Thin  Diet effective now           Diet - low sodium heart healthy           Diet renal 60/70-02-21-1198                     Code Status: Full Code     Hospital Course:   Kristi Brown is a 87 y.o. female  with medical history significant for dementia, hypertension, type 2 diabetes with retinopathy, CKD stage V not on dialysis, hypothyroidism, asthma, TIA, carcinoid tumor, history of ITP and GERD, who was brought to the hospital because of shortness of breath, diaphoresis, weakness in the extremities, lower extremity edema and slurred speech.  Reportedly, EMS discovered that her blood sugar was 58.  Blood glucose was as low as 49 in the emergency room.  She takes glipizide, according to her daughters, for diabetes.     She was admitted to the hospital for hypoglycemia, acute metabolic encephalopathy, AKI on CKD stage V and suspected UTI.  Hypoglycemia was successfully treated with IV dextrose.  Hemoglobin A1c is 5.  She was taking glipizide prior to admission.  It was recommended that glipizide be discontinued at home.  She was treated with IV fluids and IV  antibiotics.  UTI was ruled out and IV antibiotics was discontinued.  She also has secondary hyperparathyroidism, hypocalcemia and hyperphosphatemia.  She was given IV calcium and oral calcium carbonate for hypocalcemia and hyperphosphatemia.  Her family is not interested in hemodialysis because of advanced age and dementia.  Patient is deemed stable for discharge to home.   ADDENDUM:   Patient has low vitamin B12 deficiency.  B12 level was 132.  This was discussed with [name], daughter, on 10/07/2020 at 4:54 PM.  She was advised to get vitamin B12 supplement 1,000 micrograms which is available over-the-counter.  This is to be dispensed daily.     Discharge Exam:    Vitals:   10/04/20 2023 10/05/20 0527 10/05/20 0807 10/05/20 1140  BP: (!) 157/53 (!) 168/51 (!) 162/60 (!) 163/49  Pulse: 65 61 63 62  Resp: 18 18  17   Temp: 98.4 F (36.9 C) 98 F (36.7 C) 98.8 F (37.1 C) 97.9 F (36.6 C)  TempSrc:   Oral Oral  SpO2: 93% 91% 92% 100%  Weight:      Height:         GEN: NAD SKIN: Warm and dry EYES: No pallor or icterus ENT: MMM CV: RRR PULM: CTA B ABD: soft, ND, NT, +BS CNS: Alert but disoriented, non focal EXT: No edema or tenderness  The results of significant diagnostics from this hospitalization (including imaging, microbiology, ancillary and laboratory) are listed below for reference.     Procedures and Diagnostic Studies:   US Venous Img Lower Unilateral Left (DVT)  Result Date: 10/03/2020 CLINICAL DATA:  87 year old female with edema EXAM: LEFT LOWER EXTREMITY VENOUS DOPPLER ULTRASOUND TECHNIQUE: Gray-scale sonography with graded compression, as well as color Doppler and duplex ultrasound were performed to evaluate the lower extremity deep venous systems from the level of the common femoral vein and including the common femoral, femoral, profunda femoral, popliteal and calf veins including the posterior tibial, peroneal and gastrocnemius veins when visible. The  superficial great saphenous vein was also interrogated. Spectral Doppler was utilized to evaluate flow at rest and with distal augmentation maneuvers in the common femoral, femoral and popliteal veins. COMPARISON:  None. FINDINGS: Contralateral Common Femoral Vein: Respiratory phasicity is normal and symmetric with the symptomatic side. No evidence of thrombus. Normal compressibility. Common Femoral Vein: No evidence of thrombus. Normal compressibility, respiratory phasicity and response to augmentation. Saphenofemoral Junction: No evidence of thrombus. Normal compressibility and flow on color Doppler imaging. Profunda Femoral Vein: No evidence of thrombus. Normal compressibility and flow on color Doppler imaging. Femoral Vein: No evidence of thrombus. Normal compressibility, respiratory phasicity and response to augmentation. Popliteal Vein: No evidence of thrombus. Normal compressibility, respiratory phasicity and response to augmentation. Calf Veins: No evidence of thrombus. Normal compressibility and flow on color Doppler imaging. Superficial Great Saphenous Vein: No evidence of thrombus. Normal compressibility and flow on color Doppler imaging. Other Findings:  None. IMPRESSION: Sonographic survey of the left lower extremity negative for DVT Electronically Signed   By: Corrie Mckusick D.O.   On: 10/03/2020 08:50   DG Chest Port 1 View  Result Date: 10/04/2020 CLINICAL DATA:  Altered mental status EXAM: PORTABLE CHEST 1 VIEW COMPARISON:  10/02/2020 FINDINGS: Heart is borderline in size. Aortic atherosclerosis. No confluent airspace opacities or effusions. No acute bony abnormality. IMPRESSION: No active disease. Electronically Signed   By: Rolm Baptise M.D.   On: 10/04/2020 10:59   ECHOCARDIOGRAM COMPLETE  Result Date: 10/03/2020    ECHOCARDIOGRAM REPORT   Patient Name:   Kristi Brown Murry Date of Exam: 10/03/2020 Medical Rec #:  [id number]    Height:       64.0 in Accession #:    [id number]   Weight:       167.5  lb Date of Birth:  03-31-1929    BSA:          1.815 m Patient Age:    32 years     BP:           179/83 mmHg Patient Gender: F            HR:           69 bpm. Exam Location:  [facility] Procedure: 2D Echo, Color Doppler and Cardiac Doppler Indications:     R06.00 Dyspnea  History:         Patient has no prior history of Echocardiogram examinations.                  Risk Factors:Hypertension and Diabetes.  Sonographer:     Charmayne Sheer Referring Phys:  [id number] Bally T TU Diagnosing Phys: Kate Sable MD  Sonographer Comments: Global longitudinal strain was attempted. IMPRESSIONS  1. Left ventricular ejection fraction, by estimation, is 65 to 70%. The left ventricle has normal function. The left ventricle has no regional wall motion  abnormalities. Left ventricular diastolic parameters are consistent with Grade II diastolic dysfunction (pseudonormalization).  2. Right ventricular systolic function is normal. The right ventricular size is normal. There is mildly elevated pulmonary artery systolic pressure.  3. Left atrial size was mildly dilated.  4. The mitral valve is grossly normal. Mild mitral valve regurgitation.  5. The aortic valve is tricuspid. Aortic valve regurgitation is mild. Mild to moderate aortic valve sclerosis/calcification is present, without any evidence of aortic stenosis.  6. The inferior vena cava is dilated in size with >50% respiratory variability, suggesting right atrial pressure of 8 mmHg. FINDINGS  Left Ventricle: Left ventricular ejection fraction, by estimation, is 65 to 70%. The left ventricle has normal function. The left ventricle has no regional wall motion abnormalities. Global longitudinal strain performed but not reported based on interpreter judgement due to suboptimal tracking. The left ventricular internal cavity size was normal in size. There is no left ventricular hypertrophy. Left ventricular diastolic parameters are consistent with Grade II diastolic dysfunction  (pseudonormalization). Right Ventricle: The right ventricular size is normal. No increase in right ventricular wall thickness. Right ventricular systolic function is normal. There is mildly elevated pulmonary artery systolic pressure. The tricuspid regurgitant velocity is 2.78  m/s, and with an assumed right atrial pressure of 8 mmHg, the estimated right ventricular systolic pressure is 70.2 mmHg. Left Atrium: Left atrial size was mildly dilated. Right Atrium: Right atrial size was normal in size. Pericardium: There is no evidence of pericardial effusion. Mitral Valve: The mitral valve is grossly normal. Mild mitral valve regurgitation. MV peak gradient, 9.5 mmHg. The mean mitral valve gradient is 4.0 mmHg. Tricuspid Valve: The tricuspid valve is normal in structure. Tricuspid valve regurgitation is mild. Aortic Valve: The aortic valve is tricuspid. Aortic valve regurgitation is mild. Aortic regurgitation PHT measures 346 msec. Mild to moderate aortic valve sclerosis/calcification is present, without any evidence of aortic stenosis. Aortic valve mean gradient measures 7.0 mmHg. Aortic valve peak gradient measures 13.0 mmHg. Aortic valve area, by VTI measures 1.30 cm. Pulmonic Valve: The pulmonic valve was normal in structure. Pulmonic valve regurgitation is mild to moderate. Aorta: The aortic root is normal in size and structure. Venous: The inferior vena cava is dilated in size with greater than 50% respiratory variability, suggesting right atrial pressure of 8 mmHg. IAS/Shunts: No atrial level shunt detected by color flow Doppler.  LEFT VENTRICLE PLAX 2D LVIDd:         4.25 cm  Diastology LVIDs:         2.40 cm  LV e' medial:    5.77 cm/s LV PW:         0.95 cm  LV E/e' medial:  17.9 LV IVS:        0.93 cm  LV e' lateral:   5.33 cm/s LVOT diam:     1.80 cm  LV E/e' lateral: 19.3 LV SV:         69 LV SV Index:   38 LVOT Area:     2.54 cm  RIGHT VENTRICLE RV Basal diam:  3.27 cm RV Mid diam:    3.88 cm RV S prime:      14.10 cm/s LEFT ATRIUM             Index       RIGHT ATRIUM           Index LA diam:        3.50 cm 1.93 cm/m  RA Area:  11.80 cm LA Vol (A2C):   53.7 ml 29.59 ml/m RA Volume:   26.40 ml  14.55 ml/m LA Vol (A4C):   54.8 ml 30.20 ml/m LA Biplane Vol: 57.3 ml 31.58 ml/m  AORTIC VALVE                    PULMONIC VALVE AV Area (Vmax):    1.44 cm     PV Vmax:          1.29 m/s AV Area (Vmean):   1.37 cm     PV Vmean:         101.000 cm/s AV Area (VTI):     1.30 cm     PV VTI:           0.341 m AV Vmax:           180.00 cm/s  PV Peak grad:     6.7 mmHg AV Vmean:          132.000 cm/s PV Mean grad:     4.0 mmHg AV VTI:            0.532 m      PR End Diast Vel: 11.56 msec AV Peak Grad:      13.0 mmHg AV Mean Grad:      7.0 mmHg LVOT Vmax:         102.00 cm/s LVOT Vmean:        71.000 cm/s LVOT VTI:          0.272 m LVOT/AV VTI ratio: 0.51 AI PHT:            346 msec  AORTA Ao Root diam: 3.00 cm MITRAL VALVE                TRICUSPID VALVE MV Area (PHT): 4.36 cm     TR Peak grad:   30.9 mmHg MV Area VTI:   1.86 cm     TR Vmax:        278.00 cm/s MV Peak grad:  9.5 mmHg MV Mean grad:  4.0 mmHg     SHUNTS MV Vmax:       1.54 m/s     Systemic VTI:  0.27 m MV Vmean:      87.1 cm/s    Systemic Diam: 1.80 cm MV Decel Time: 174 msec MV E velocity: 103.00 cm/s MV A velocity: 129.00 cm/s MV E/A ratio:  0.80 Kate Sable MD Electronically signed by Kate Sable MD Signature Date/Time: 10/03/2020/10:45:38 AM    Final      Labs:   Basic Metabolic Panel: Recent Labs  Lab 10/02/20 2038 10/03/20 0511 10/04/20 0623 10/05/20 0546  NA 135 137 137 135  K 4.7 4.5 4.4 4.2  CL 101 104 105 104  CO2 19* 21* 22 22  GLUCOSE 63* 65* 77 64*  BUN 64* 64* 60* 63*  CREATININE 6.45* 6.63* 5.92* 6.13*  CALCIUM 6.7* 6.4* 6.5* 6.5*  PHOS  --  9.1* 9.0* 8.5*   GFR Estimated Creatinine Clearance: 6.2 mL/min (A) (by C-G formula based on SCr of 6.13 mg/dL (H)). Liver Function Tests: Recent Labs  Lab  10/02/20 2038 10/03/20 0511 10/04/20 0623 10/05/20 0546  AST 16  --   --   --   ALT 11  --   --   --   ALKPHOS 98  --   --   --   BILITOT 0.9  --   --   --   PROT 7.1  --   --   --  ALBUMIN 3.8 3.3* 3.2* 3.2*   No results for input(s): LIPASE, AMYLASE in the last 168 hours. No results for input(s): AMMONIA in the last 168 hours. Coagulation profile No results for input(s): INR, PROTIME in the last 168 hours.  CBC: Recent Labs  Lab 10/02/20 2038 10/04/20 0623 10/05/20 0546  WBC 7.9 6.0 6.0  NEUTROABS 5.1 3.1 3.1  HGB 8.4* 7.8* 7.5*  HCT 25.3* 22.4* 22.6*  MCV 98.4 98.7 98.3  PLT 215 191 189   Cardiac Enzymes: No results for input(s): CKTOTAL, CKMB, CKMBINDEX, TROPONINI in the last 168 hours. BNP: Invalid input(s): POCBNP CBG: Recent Labs  Lab 10/03/20 1429 10/04/20 2142 10/05/20 0810 10/05/20 0828 10/05/20 1148  GLUCAP 143* 113* 69* 88 130*   D-Dimer No results for input(s): DDIMER in the last 72 hours. Hgb A1c Recent Labs    10/03/20 0511  HGBA1C 5.0   Lipid Profile No results for input(s): CHOL, HDL, LDLCALC, TRIG, CHOLHDL, LDLDIRECT in the last 72 hours. Thyroid function studies No results for input(s): TSH, T4TOTAL, T3FREE, THYROIDAB in the last 72 hours.  Invalid input(s): FREET3 Anemia work up Recent Labs    10/05/20 0815  VITAMINB12 132*  FERRITIN 194  TIBC 252  IRON 63   Microbiology Recent Results (from the past 240 hour(s))  Urine Culture     Status: Abnormal   Collection Time: 10/02/20  9:29 PM   Specimen: Urine, Random  Result Value Ref Range Status   Specimen Description   Final    URINE, RANDOM Performed at [facility], [address] [place], [place] [address]    Special Requests   Final    NONE Performed at [facility], [address] [place], [place] [address]    Culture MULTIPLE SPECIES PRESENT, SUGGEST RECOLLECTION (A)  Final   Report Status 10/04/2020 FINAL  Final  Resp Panel by RT-PCR (Flu  A&B, Covid) Nasopharyngeal Swab     Status: None   Collection Time: 10/02/20 11:11 PM   Specimen: Nasopharyngeal Swab; Nasopharyngeal(NP) swabs in vial transport medium  Result Value Ref Range Status   SARS Coronavirus 2 by RT PCR NEGATIVE NEGATIVE Final    Comment: (NOTE) SARS-CoV-2 target nucleic acids are NOT DETECTED.  The SARS-CoV-2 RNA is generally detectable in upper respiratory specimens during the acute phase of infection. The lowest concentration of SARS-CoV-2 viral copies this assay can detect is 138 copies/mL. A negative result does not preclude SARS-Cov-2 infection and should not be used as the sole basis for treatment or other patient management decisions. A negative result may occur with  improper specimen collection/handling, submission of specimen other than nasopharyngeal swab, presence of viral mutation(s) within the areas targeted by this assay, and inadequate number of viral copies(<138 copies/mL). A negative result must be combined with clinical observations, patient history, and epidemiological information. The expected result is Negative.  Fact Sheet for Patients:  EntrepreneurPulse.com.au  Fact Sheet for Healthcare Providers:  IncredibleEmployment.be  This test is no t yet approved or cleared by the [place] FDA and  has been authorized for detection and/or diagnosis of SARS-CoV-2 by FDA under an Emergency Use Authorization (EUA). This EUA will remain  in effect (meaning this test can be used) for the duration of the COVID-19 declaration under Section 564(b)(1) of the Act, 21 U.S.C.section 360bbb-3(b)(1), unless the authorization is terminated  or revoked sooner.       Influenza A by PCR NEGATIVE NEGATIVE Final   Influenza B by PCR NEGATIVE NEGATIVE Final  Comment: (NOTE) The Xpert Xpress SARS-CoV-2/FLU/RSV plus assay is intended as an aid in the diagnosis of influenza from Nasopharyngeal swab specimens  and should not be used as a sole basis for treatment. Nasal washings and aspirates are unacceptable for Xpert Xpress SARS-CoV-2/FLU/RSV testing.  Fact Sheet for Patients: EntrepreneurPulse.com.au  Fact Sheet for Healthcare Providers: IncredibleEmployment.be  This test is not yet approved or cleared by the [place] FDA and has been authorized for detection and/or diagnosis of SARS-CoV-2 by FDA under an Emergency Use Authorization (EUA). This EUA will remain in effect (meaning this test can be used) for the duration of the COVID-19 declaration under Section 564(b)(1) of the Act, 21 U.S.C. section 360bbb-3(b)(1), unless the authorization is terminated or revoked.  Performed at [facility], [address], [place], [place] [address]      Discharge Instructions:   Discharge Instructions     Diet - low sodium heart healthy   Complete by: As directed    Diet renal 60/70-02-21-1198   Complete by: As directed    Face-to-face encounter (required for Medicare/Medicaid patients)   Complete by: As directed    I De[place] certify that this patient is under my care and that I, or a nurse practitioner or physician's assistant working with me, had a face-to-face encounter that meets the physician face-to-face encounter requirements with this patient on 10/05/2020. The encounter with the patient was in whole, or in part for the following medical condition(s) which is the primary reason for home health care (List medical condition): Debility, CHF   The encounter with the patient was in whole, or in part, for the following medical condition, which is the primary reason for home health care: Debility, CHF   I certify that, based on my findings, the following services are medically necessary home health services: Physical therapy   Reason for Medically Necessary Home Health Services: Therapy- Personnel officer, Training and development officer and Stair Training   My  clinical findings support the need for the above services: Unsafe ambulation due to balance issues   Further, I certify that my clinical findings support that this patient is homebound due to: Unsafe ambulation due to balance issues   Home Health   Complete by: As directed    To provide the following care/treatments: PT   Increase activity slowly   Complete by: As directed       Allergies as of 10/05/2020       Reactions   Memantine Other (See Comments)   Excessive sedation Excessive sedation        Medication List     STOP taking these medications    Avastin 100 MG/4ML Soln Generic drug: Bevacizumab   glipiZIDE 5 MG 24 hr tablet Commonly known as: GLUCOTROL XL   losartan 25 MG tablet Commonly known as: COZAAR       TAKE these medications    Advair HFA 115-21 MCG/ACT inhaler Generic drug: fluticasone-salmeterol 1 puff twice daily   albuterol 108 (90 Base) MCG/ACT inhaler Commonly known as: VENTOLIN HFA Inhale 1-2 puffs into the lungs every 6 (six) hours as needed for wheezing or shortness of breath.   amLODipine 10 MG tablet Commonly known as: NORVASC Take 10 mg by mouth daily.   aspirin 81 MG EC tablet Take 81 mg by mouth daily.   Assure Comfort Lancets 28G Misc   B-D SINGLE USE SWABS REGULAR Pads To be used for checking glucose twice daily   calcium carbonate 1250 (500 Ca) MG tablet Commonly  known as: OS-CAL - dosed in mg of elemental calcium Take 1 tablet (500 mg of elemental calcium total) by mouth 2 (two) times daily with a meal.   carvedilol 25 MG tablet Commonly known as: COREG Take 25 mg by mouth 2 (two) times daily with a meal.   clopidogrel 75 MG tablet Commonly known as: PLAVIX Take 75 mg by mouth daily.   donepezil 5 MG tablet Commonly known as: ARICEPT Take by mouth.   esomeprazole 40 MG capsule Commonly known as: NEXIUM Take 40 mg by mouth daily at 12 noon.   fluticasone 50 MCG/ACT nasal spray Commonly known as:  FLONASE Place into both nostrils daily.   hydrochlorothiazide 12.5 MG capsule Commonly known as: MICROZIDE Take 12.5 mg by mouth daily.   levothyroxine 25 MCG tablet Commonly known as: SYNTHROID TAKE ONE TABLET BY MOUTH EVERY MORNING   lisinopril 10 MG tablet Commonly known as: ZESTRIL Take 20 mg by mouth daily.   loratadine 10 MG tablet Commonly known as: CLARITIN Take by mouth.   memantine 5 MG tablet Commonly known as: NAMENDA Take 5mg  a.m. for 2 weeks, then increase to 10mg  a.m.   nitroGLYCERIN 0.4 MG SL tablet Commonly known as: NITROSTAT Place under the tongue.   pravastatin 40 MG tablet Commonly known as: PRAVACHOL Take 40 mg by mouth daily.   Precision QID Test test strip Generic drug: glucose blood To be used to check glucose twice daily. E11.9   QUEtiapine 25 MG tablet Commonly known as: SEROQUEL TAKE 1/2 TABLET BY MOUTH TWICE DAILY. TAKE AT LUNCH & DINNER TIME           If you experience worsening of your admission symptoms, develop shortness of breath, life threatening emergency, suicidal or homicidal thoughts you must seek medical attention immediately by calling 911 or calling your MD immediately  if symptoms less severe.   You must read complete instructions/literature along with all the possible adverse reactions/side effects for all the medicines you take and that have been prescribed to you. Take any new medicines after you have completely understood and accept all the possible adverse reactions/side effects.    Please note   You were cared for by a hospitalist during your hospital stay. If you have any questions about your discharge medications or the care you received while you were in the hospital after you are discharged, you can call the unit and asked to speak with the hospitalist on call if the hospitalist that took care of you is not available. Once you are discharged, your primary care physician will handle any further medical issues.  Please note that NO REFILLS for any discharge medications will be authorized once you are discharged, as it is imperative that you return to your primary care physician (or establish a relationship with a primary care physician if you do not have one) for your aftercare needs so that they can reassess your need for medications and monitor your lab values.       Time coordinating discharge: 34 minutes  Signed:  Shayley Medlin  [facility] 10/05/2020, 3:16 PM   Pager on [email or web address]. If 7PM-7AM, please contact night-coverage at [email or web address]

## 2020-10-05 NOTE — Plan of Care (Signed)
Patient alert and oriented to self. Patient reports having no pain, resting well vital signs are stable. Will continue to monitor.  Problem: Education: Goal: Knowledge of General Education information will improve Description: Including pain rating scale, medication(s)/side effects and non-pharmacologic comfort measures Outcome: Progressing   Problem: Health Behavior/Discharge Planning: Goal: Ability to manage health-related needs will improve Outcome: Progressing   Problem: Clinical Measurements: Goal: Ability to maintain clinical measurements within normal limits will improve Outcome: Progressing   Problem: Skin Integrity: Goal: Risk for impaired skin integrity will decrease Outcome: Progressing   Problem: Pain Managment: Goal: General experience of comfort will improve Outcome: Progressing

## 2020-11-20 NOTE — Progress Notes (Deleted)
   Patient ID: Kristi Brown, female    DOB: 02/18/1929, 87 y.o.   MRN: [id number]  HPI  Kristi Brown is a 87 y/o female with a history of  Echo report from 10/03/20 reviewed and showed an EF of 65-70% along with mildly elevated PA pressure, mild LAE and mild MR.   Was in the ED 11/12/20 due to back pain, cough, shortness of breath and pedal edema. Given IV lasix with transition to oral diuretics. Family is not interested in dialysis. Discharged after 2 days.   She presents today for her initial visit with a chief complaint of   Review of Systems    Physical Exam  Assessment & Plan:  1: Chronic heart failure with preserved ejection fraction with structural changes (LAE)- - NYHA class  - BNP 11/12/20 was 181.51  2: HTN with CKD- - BP - saw PCP Kristi Brown) 07/30/20 - BMP 11/14/20 reviewed and showed sodium 136, potassium 4.2, creatinine 7.72 & GFR 5  3: DM- - A1c 10/03/20 was 5.0%  4: Dementia-

## 2020-11-21 ENCOUNTER — Ambulatory Visit: Payer: Medicare HMO | Admitting: Family

## 2020-11-26 ENCOUNTER — Other Ambulatory Visit: Payer: Self-pay

## 2020-11-26 ENCOUNTER — Encounter: Payer: Self-pay | Admitting: Podiatry

## 2020-11-26 ENCOUNTER — Ambulatory Visit: Payer: Medicare HMO | Admitting: Podiatry

## 2020-11-26 DIAGNOSIS — N189 Chronic kidney disease, unspecified: Secondary | ICD-10-CM

## 2020-11-26 DIAGNOSIS — B351 Tinea unguium: Secondary | ICD-10-CM | POA: Diagnosis not present

## 2020-11-26 DIAGNOSIS — E119 Type 2 diabetes mellitus without complications: Secondary | ICD-10-CM

## 2020-11-26 DIAGNOSIS — M79674 Pain in right toe(s): Secondary | ICD-10-CM

## 2020-11-26 DIAGNOSIS — M79675 Pain in left toe(s): Secondary | ICD-10-CM

## 2020-11-26 NOTE — Progress Notes (Signed)
This patient returns to my office for at risk foot care.  This patient requires this care by a professional since this patient will be at risk due to having diabetes.  This patient is unable to cut nails herself since the patient cannot reach her nails.These nails are painful walking and wearing shoes.  This patient presents for at risk foot care today.  She presents to the office with daughter.  General Appearance  Alert, conversant and in no acute stress.  Vascular  Dorsalis pedis and posterior tibial  pulses are weakly  palpable  bilaterally.  Capillary return is within normal limits  bilaterally. Absent pedal hair.  bilaterally.  Neurologic  Senn-Weinstein monofilament wire test within normal limits  bilaterally. Muscle power within normal limits bilaterally.  Nails Thick disfigured discolored nails with subungual debris  from hallux to fifth toes bilaterally. No evidence of bacterial infection or drainage bilaterally.  Orthopedic  No limitations of motion  feet .  No crepitus or effusions noted.  No bony pathology or digital deformities noted. Contracted fifth toe left foot.    Skin  normotropic skin with no porokeratosis noted bilaterally.  No signs of infections or ulcers noted.     Onychomycosis  Pain in right toes  Pain in left toes  Consent was obtained for treatment procedures.   Mechanical debridement of nails 1-5  bilaterally performed with a nail nipper.  Filed with dremel without incident.   Return office visit    10 weeks                  Told patient to return for periodic foot care and evaluation due to potential at risk complications.   Gardiner Barefoot DPM

## 2020-12-22 NOTE — Progress Notes (Deleted)
   Patient ID: Kristi Brown, female    DOB: [birth year], 87 y.o.   MRN: [id number]  HPI  Kristi Brown is a 87 y/o female with a history of  Echo report from 10/03/20 reviewed and showed an EF of 65-70% along with mildly elevated PA pressure, mild LAE and mild MR/AR.   Admitted 11/12/20 due to lower back pain, shortness of breath and lower extremity swelling. Initially given IV lasix with transition to oral diuretics. Creatinine rising but patient nor family have interest in dialysis due to age/ dementia. PT/OT evaluations done.   She presents today for her initial visit with a chief complaint of   Review of Systems    Physical Exam  Assessment & Plan:  1: Chronic heart failure with preserved ejection fraction with structural changes (LAE)- - NYHA class - BNP 11/12/20 was 181.51  2: HTN- - BP - saw PCP Arby Barrette) 12/20/20 - BMP 12/18/20 reviewed and showed sodium 132, potassium 5.1, creatinine 8.37 and GFR 4  3: DM- - A1c 12/20/20 was 5.8%  4: ESRD without dialysis- - saw [facility] nephrology 12/18/20 - most recent GFR 4; discussion about palliative care - patient nor family interested in dialysis  5: Dementia- - followed at [facility] memory clinic

## 2020-12-24 ENCOUNTER — Telehealth: Payer: Self-pay | Admitting: Family

## 2020-12-24 ENCOUNTER — Ambulatory Visit: Payer: Medicare HMO | Admitting: Family

## 2020-12-24 NOTE — Telephone Encounter (Signed)
Patient did not show for her [facility] appointment on 12/24/20. Will attempt to reschedule.

## 2021-01-13 ENCOUNTER — Inpatient Hospital Stay
Admission: EM | Admit: 2021-01-13 | Discharge: 2021-01-22 | DRG: 682 | Disposition: A | Discharge status: Hospice | Payer: Medicare HMO | Attending: Internal Medicine | Admitting: Internal Medicine

## 2021-01-13 ENCOUNTER — Other Ambulatory Visit: Payer: Self-pay

## 2021-01-13 ENCOUNTER — Emergency Department: Payer: Medicare HMO

## 2021-01-13 DIAGNOSIS — Z9115 Patient's noncompliance with renal dialysis: Secondary | ICD-10-CM

## 2021-01-13 DIAGNOSIS — N189 Chronic kidney disease, unspecified: Secondary | ICD-10-CM | POA: Diagnosis not present

## 2021-01-13 DIAGNOSIS — E8779 Other fluid overload: Secondary | ICD-10-CM | POA: Diagnosis present

## 2021-01-13 DIAGNOSIS — G309 Alzheimer's disease, unspecified: Secondary | ICD-10-CM | POA: Diagnosis not present

## 2021-01-13 DIAGNOSIS — Z79890 Hormone replacement therapy (postmenopausal): Secondary | ICD-10-CM | POA: Diagnosis not present

## 2021-01-13 DIAGNOSIS — E785 Hyperlipidemia, unspecified: Secondary | ICD-10-CM | POA: Diagnosis present

## 2021-01-13 DIAGNOSIS — R059 Cough, unspecified: Secondary | ICD-10-CM | POA: Diagnosis present

## 2021-01-13 DIAGNOSIS — D631 Anemia in chronic kidney disease: Secondary | ICD-10-CM | POA: Diagnosis present

## 2021-01-13 DIAGNOSIS — K219 Gastro-esophageal reflux disease without esophagitis: Secondary | ICD-10-CM | POA: Diagnosis present

## 2021-01-13 DIAGNOSIS — J9601 Acute respiratory failure with hypoxia: Secondary | ICD-10-CM | POA: Diagnosis present

## 2021-01-13 DIAGNOSIS — Z515 Encounter for palliative care: Secondary | ICD-10-CM | POA: Diagnosis not present

## 2021-01-13 DIAGNOSIS — D693 Immune thrombocytopenic purpura: Secondary | ICD-10-CM | POA: Diagnosis present

## 2021-01-13 DIAGNOSIS — N185 Chronic kidney disease, stage 5: Secondary | ICD-10-CM | POA: Diagnosis present

## 2021-01-13 DIAGNOSIS — Z66 Do not resuscitate: Secondary | ICD-10-CM | POA: Diagnosis not present

## 2021-01-13 DIAGNOSIS — I5033 Acute on chronic diastolic (congestive) heart failure: Secondary | ICD-10-CM | POA: Diagnosis present

## 2021-01-13 DIAGNOSIS — Z79899 Other long term (current) drug therapy: Secondary | ICD-10-CM | POA: Diagnosis not present

## 2021-01-13 DIAGNOSIS — Z20822 Contact with and (suspected) exposure to covid-19: Secondary | ICD-10-CM | POA: Diagnosis present

## 2021-01-13 DIAGNOSIS — E1122 Type 2 diabetes mellitus with diabetic chronic kidney disease: Secondary | ICD-10-CM | POA: Diagnosis present

## 2021-01-13 DIAGNOSIS — E8770 Fluid overload, unspecified: Secondary | ICD-10-CM

## 2021-01-13 DIAGNOSIS — Z888 Allergy status to other drugs, medicaments and biological substances status: Secondary | ICD-10-CM

## 2021-01-13 DIAGNOSIS — F0390 Unspecified dementia without behavioral disturbance: Secondary | ICD-10-CM | POA: Diagnosis present

## 2021-01-13 DIAGNOSIS — H3530 Unspecified macular degeneration: Secondary | ICD-10-CM | POA: Diagnosis present

## 2021-01-13 DIAGNOSIS — Z7951 Long term (current) use of inhaled steroids: Secondary | ICD-10-CM

## 2021-01-13 DIAGNOSIS — E039 Hypothyroidism, unspecified: Secondary | ICD-10-CM | POA: Diagnosis present

## 2021-01-13 DIAGNOSIS — N179 Acute kidney failure, unspecified: Secondary | ICD-10-CM | POA: Diagnosis present

## 2021-01-13 DIAGNOSIS — E871 Hypo-osmolality and hyponatremia: Secondary | ICD-10-CM | POA: Diagnosis present

## 2021-01-13 DIAGNOSIS — Z7189 Other specified counseling: Secondary | ICD-10-CM | POA: Diagnosis not present

## 2021-01-13 DIAGNOSIS — Z7982 Long term (current) use of aspirin: Secondary | ICD-10-CM

## 2021-01-13 DIAGNOSIS — Z7902 Long term (current) use of antithrombotics/antiplatelets: Secondary | ICD-10-CM

## 2021-01-13 DIAGNOSIS — I10 Essential (primary) hypertension: Secondary | ICD-10-CM | POA: Diagnosis present

## 2021-01-13 DIAGNOSIS — Z8673 Personal history of transient ischemic attack (TIA), and cerebral infarction without residual deficits: Secondary | ICD-10-CM

## 2021-01-13 DIAGNOSIS — I132 Hypertensive heart and chronic kidney disease with heart failure and with stage 5 chronic kidney disease, or end stage renal disease: Secondary | ICD-10-CM | POA: Diagnosis present

## 2021-01-13 DIAGNOSIS — F0280 Dementia in other diseases classified elsewhere without behavioral disturbance: Secondary | ICD-10-CM | POA: Diagnosis not present

## 2021-01-13 LAB — COMPREHENSIVE METABOLIC PANEL
ALT: 9 U/L (ref 0–44)
AST: 11 U/L — ABNORMAL LOW (ref 15–41)
Albumin: 3.1 g/dL — ABNORMAL LOW (ref 3.5–5.0)
Alkaline Phosphatase: 65 U/L (ref 38–126)
Anion gap: 13 (ref 5–15)
BUN: 83 mg/dL — ABNORMAL HIGH (ref 8–23)
CO2: 21 mmol/L — ABNORMAL LOW (ref 22–32)
Calcium: 7.8 mg/dL — ABNORMAL LOW (ref 8.9–10.3)
Chloride: 93 mmol/L — ABNORMAL LOW (ref 98–111)
Creatinine, Ser: 10.01 mg/dL — ABNORMAL HIGH (ref 0.44–1.00)
GFR, Estimated: 3 mL/min — ABNORMAL LOW (ref >60)
Glucose, Bld: 124 mg/dL — ABNORMAL HIGH (ref 70–99)
Potassium: 5.1 mmol/L (ref 3.5–5.1)
Sodium: 127 mmol/L — ABNORMAL LOW (ref 135–145)
Total Bilirubin: 1.2 mg/dL (ref 0.3–1.2)
Total Protein: 6.9 g/dL (ref 6.5–8.1)

## 2021-01-13 LAB — CBC WITH DIFFERENTIAL/PLATELET
Abs Immature Granulocytes: 0.1 10*3/uL — ABNORMAL HIGH (ref 0.00–0.07)
Basophils Absolute: 0.1 10*3/uL (ref 0.0–0.1)
Basophils Relative: 1 %
Eosinophils Absolute: 0.1 10*3/uL (ref 0.0–0.5)
Eosinophils Relative: 1 %
HCT: 23 % — ABNORMAL LOW (ref 36.0–46.0)
Hemoglobin: 7.4 g/dL — ABNORMAL LOW (ref 12.0–15.0)
Immature Granulocytes: 1 %
Lymphocytes Relative: 15 %
Lymphs Abs: 1.2 10*3/uL (ref 0.7–4.0)
MCH: 31.9 pg (ref 26.0–34.0)
MCHC: 32.2 g/dL (ref 30.0–36.0)
MCV: 99.1 fL (ref 80.0–100.0)
Monocytes Absolute: 1 10*3/uL (ref 0.1–1.0)
Monocytes Relative: 13 %
Neutro Abs: 5.3 10*3/uL (ref 1.7–7.7)
Neutrophils Relative %: 69 %
Platelets: 229 10*3/uL (ref 150–400)
RBC: 2.32 MIL/uL — ABNORMAL LOW (ref 3.87–5.11)
RDW: 12.8 % (ref 11.5–15.5)
WBC: 7.6 10*3/uL (ref 4.0–10.5)
nRBC: 0 % (ref 0.0–0.2)

## 2021-01-13 LAB — URINALYSIS, ROUTINE W REFLEX MICROSCOPIC
Bilirubin Urine: NEGATIVE
Glucose, UA: NEGATIVE mg/dL
Hgb urine dipstick: NEGATIVE
Ketones, ur: NEGATIVE mg/dL
Nitrite: 0.2 — AB
Protein, ur: >300 mg/dL — AB
Specific Gravity, Urine: 1.02 (ref 1.005–1.030)
pH: 5 (ref 5.0–8.0)

## 2021-01-13 LAB — RESP PANEL BY RT-PCR (FLU A&B, COVID) ARPGX2
Influenza A by PCR: NEGATIVE
Influenza B by PCR: NEGATIVE
SARS Coronavirus 2 by RT PCR: NEGATIVE

## 2021-01-13 LAB — LACTIC ACID, PLASMA
Lactic Acid, Venous: 0.7 mmol/L (ref 0.5–1.9)
Lactic Acid, Venous: 0.7 mmol/L (ref 0.5–1.9)

## 2021-01-13 LAB — BRAIN NATRIURETIC PEPTIDE: B Natriuretic Peptide: 689.5 pg/mL — ABNORMAL HIGH (ref 0.0–100.0)

## 2021-01-13 LAB — TROPONIN I (HIGH SENSITIVITY): Troponin I (High Sensitivity): 13 ng/L (ref <18)

## 2021-01-13 MED ORDER — PANTOPRAZOLE SODIUM 40 MG PO TBEC
80.0000 mg | DELAYED_RELEASE_TABLET | Freq: Every day | ORAL | Status: DC
  Administered 2021-01-13 – 2021-01-17 (×5): 80 mg via ORAL
  Filled 2021-01-13 (×5): qty 2

## 2021-01-13 MED ORDER — LEVOTHYROXINE SODIUM 25 MCG PO TABS
25.0000 ug | ORAL_TABLET | Freq: Every day | ORAL | Status: DC
  Administered 2021-01-14 – 2021-01-17 (×4): 25 ug via ORAL
  Filled 2021-01-13 (×5): qty 1

## 2021-01-13 MED ORDER — FUROSEMIDE 10 MG/ML IJ SOLN
80.0000 mg | Freq: Two times a day (BID) | INTRAMUSCULAR | Status: DC
  Administered 2021-01-13 – 2021-01-16 (×6): 80 mg via INTRAVENOUS
  Filled 2021-01-13 (×6): qty 8

## 2021-01-13 MED ORDER — IPRATROPIUM-ALBUTEROL 0.5-2.5 (3) MG/3ML IN SOLN
3.0000 mL | Freq: Three times a day (TID) | RESPIRATORY_TRACT | Status: DC
  Administered 2021-01-13 – 2021-01-15 (×6): 3 mL via RESPIRATORY_TRACT
  Filled 2021-01-13 (×6): qty 3

## 2021-01-13 MED ORDER — AZITHROMYCIN 500 MG PO TABS
500.0000 mg | ORAL_TABLET | Freq: Every day | ORAL | Status: AC
  Administered 2021-01-13: 11:00:00 500 mg via ORAL
  Filled 2021-01-13: qty 1

## 2021-01-13 MED ORDER — SODIUM CHLORIDE 0.9 % IV SOLN
1.0000 g | INTRAVENOUS | Status: DC
  Administered 2021-01-13: 10:00:00 1 g via INTRAVENOUS
  Filled 2021-01-13 (×2): qty 10

## 2021-01-13 MED ORDER — ASPIRIN EC 81 MG PO TBEC
81.0000 mg | DELAYED_RELEASE_TABLET | Freq: Every day | ORAL | Status: DC
  Administered 2021-01-13 – 2021-01-18 (×6): 81 mg via ORAL
  Filled 2021-01-13 (×6): qty 1

## 2021-01-13 MED ORDER — AZITHROMYCIN 250 MG PO TABS: 250.0000 mg | ORAL_TABLET | Freq: Every day | ORAL | Status: DC

## 2021-01-13 MED ORDER — DONEPEZIL HCL 5 MG PO TABS
10.0000 mg | ORAL_TABLET | Freq: Every morning | ORAL | Status: DC
  Administered 2021-01-13 – 2021-01-18 (×6): 10 mg via ORAL
  Filled 2021-01-13 (×5): qty 2

## 2021-01-13 MED ORDER — FUROSEMIDE 10 MG/ML IJ SOLN: 40.0000 mg | Freq: Two times a day (BID) | INTRAMUSCULAR | Status: DC

## 2021-01-13 MED ORDER — FUROSEMIDE 10 MG/ML IJ SOLN
60.0000 mg | Freq: Once | INTRAMUSCULAR | Status: AC
  Administered 2021-01-13: 07:00:00 60 mg via INTRAVENOUS
  Filled 2021-01-13: qty 8

## 2021-01-13 MED ORDER — PRAVASTATIN SODIUM 20 MG PO TABS
40.0000 mg | ORAL_TABLET | Freq: Every evening | ORAL | Status: DC
  Administered 2021-01-13 – 2021-01-17 (×5): 40 mg via ORAL
  Filled 2021-01-13 (×3): qty 1
  Filled 2021-01-13: qty 2
  Filled 2021-01-13: qty 1

## 2021-01-13 MED ORDER — AMLODIPINE BESYLATE 10 MG PO TABS
10.0000 mg | ORAL_TABLET | Freq: Every day | ORAL | Status: DC
  Administered 2021-01-13 – 2021-01-17 (×5): 10 mg via ORAL
  Filled 2021-01-13 (×2): qty 1
  Filled 2021-01-13: qty 2
  Filled 2021-01-13 (×2): qty 1

## 2021-01-13 MED ORDER — LORATADINE 10 MG PO TABS: 10.0000 mg | ORAL_TABLET | Freq: Every day | ORAL | Status: DC | PRN

## 2021-01-13 MED ORDER — CARVEDILOL 25 MG PO TABS
25.0000 mg | ORAL_TABLET | Freq: Two times a day (BID) | ORAL | Status: DC
  Administered 2021-01-13 – 2021-01-17 (×9): 25 mg via ORAL
  Filled 2021-01-13 (×8): qty 1
  Filled 2021-01-13: qty 4

## 2021-01-13 MED ORDER — FLUTICASONE FUROATE-VILANTEROL 200-25 MCG/ACT IN AEPB
1.0000 | INHALATION_SPRAY | Freq: Every day | RESPIRATORY_TRACT | Status: DC
  Administered 2021-01-14 – 2021-01-17 (×4): 1 via RESPIRATORY_TRACT
  Filled 2021-01-13 (×2): qty 28

## 2021-01-13 MED ORDER — HEPARIN SODIUM (PORCINE) 5000 UNIT/ML IJ SOLN
5000.0000 [IU] | Freq: Three times a day (TID) | INTRAMUSCULAR | Status: DC
  Administered 2021-01-13 – 2021-01-17 (×14): 5000 [IU] via SUBCUTANEOUS
  Filled 2021-01-13 (×14): qty 1

## 2021-01-13 MED ORDER — GUAIFENESIN ER 600 MG PO TB12
600.0000 mg | ORAL_TABLET | Freq: Two times a day (BID) | ORAL | Status: DC
  Administered 2021-01-13 – 2021-01-14 (×2): 600 mg via ORAL
  Filled 2021-01-13 (×3): qty 1

## 2021-01-13 MED ORDER — QUETIAPINE FUMARATE 25 MG PO TABS
12.5000 mg | ORAL_TABLET | Freq: Two times a day (BID) | ORAL | Status: DC
  Administered 2021-01-13 – 2021-01-18 (×8): 12.5 mg via ORAL
  Filled 2021-01-13 (×10): qty 1

## 2021-01-13 MED ORDER — ALBUTEROL SULFATE HFA 108 (90 BASE) MCG/ACT IN AERS: 1.0000 | INHALATION_SPRAY | Freq: Four times a day (QID) | RESPIRATORY_TRACT | Status: DC | PRN

## 2021-01-13 MED ORDER — ALBUTEROL SULFATE (2.5 MG/3ML) 0.083% IN NEBU: 2.5000 mg | INHALATION_SOLUTION | Freq: Four times a day (QID) | RESPIRATORY_TRACT | Status: DC | PRN

## 2021-01-13 MED ORDER — CALCIUM CARBONATE 1250 (500 CA) MG PO TABS
1.0000 | ORAL_TABLET | Freq: Two times a day (BID) | ORAL | Status: DC
  Administered 2021-01-13 – 2021-01-17 (×8): 500 mg via ORAL
  Filled 2021-01-13 (×11): qty 1

## 2021-01-13 NOTE — ED Provider Notes (Signed)
[facility] Emergency Department Provider Note  Time seen: 7:25 AM  I have reviewed the triage vital signs and the nursing notes.   HISTORY  Chief Complaint Shortness of breath  HPI Kristi Brown is a 87 y.o. female with a past medical history of diabetes, CKD stage V not currently on dialysis, gastric reflux, hypertension, presents to the emergency department for shortness of breath and chest pain.  Who presents to the emergency department for 1 week of symptoms.  According to the patient for the past 1 week she has been feeling short of breath, weak with cough.  No known fever.  Daughter is here with the patient is states she does not want to be on dialysis.  Occasional sputum production over the past 1 week with her cough.  Patient is somnolent but will awaken to voice and answer questions appropriately.    Past Medical History:  Diagnosis Date   Diabetes mellitus without complication ([place])    GERD (gastroesophageal reflux disease)    Hypertension    Thyroid disease     Patient Active Problem List   Diagnosis Date Noted   Type 2 diabetes mellitus with hypoglycemia without coma ([place]) 10/03/2020   Acute lower UTI 10/03/2020   Acute kidney injury superimposed on chronic kidney disease ([place]) 10/03/2020   Bilateral lower extremity edema 10/03/2020   AMS (altered mental status) 10/02/2020   Dementia ([place]) 03/20/2020   TIA (transient ischemic attack) 09/19/2019   History of ITP 04/25/2019   Major neurocognitive disorder due to probable Alzheimer's disease, with behavioral disturbance ([place]) 10/12/2018   Pain due to onychomycosis of toenails of both feet 08/05/2018   Diabetes mellitus without complication ([place]) 21/19/4174   Asthma 07/16/2018   Carcinoid (except of appendix) 07/16/2018   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   Dyslipidemia 11/20/2017   Anemia of renal disease 07/03/2017   Iron deficiency anemia 07/15/2016   Vitamin D deficiency 07/15/2016    'light-for-dates' infant with signs of fetal malnutrition 07/14/2016   Chronic kidney disease, unspecified 02/28/2016   GERD (gastroesophageal reflux disease) 02/28/2016   Hypothyroid 02/28/2016   Syncope 02/28/2016   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 Authorizing Provider  albuterol (VENTOLIN HFA) 108 (90 Base) MCG/ACT inhaler Inhale 1-2 puffs into the lungs every 6 (six) hours as needed for wheezing or shortness of breath. 02/26/18 07/16/19  [provider]  Alcohol Swabs (B-D SINGLE USE SWABS REGULAR) PADS To be used for checking glucose twice daily 07/22/18   [provider]  amLODipine (NORVASC) 10 MG tablet Take 10 mg by mouth daily. 11/12/18   [provider]  aspirin 81 MG EC tablet Take 81 mg by mouth daily.    [provider]  Assure Comfort Lancets 28G [place]  06/08/14   [provider]  calcium carbonate (OS-CAL - DOSED IN MG OF ELEMENTAL CALCIUM) 1250 (500 Ca) MG tablet Take 1 tablet (500 mg of elemental calcium total) by mouth 2 (two)  times daily with a meal. 10/05/20   Kristi Boroughs, MD  carvedilol (COREG) 25 MG tablet Take 25 mg by mouth 2 (two) times daily with a meal. 07/08/18 10/03/20  [provider]  clopidogrel (PLAVIX) 75 MG tablet Take 75 mg by mouth daily. 04/25/20   [provider]  donepezil (ARICEPT) 5 MG tablet Take by mouth. 10/12/18 10/03/20  [provider]   esomeprazole (NEXIUM) 40 MG capsule Take 40 mg by mouth daily at 12 noon. 03/21/13 10/03/20  [provider]  fluticasone (FLONASE) 50 MCG/ACT nasal spray Place into both nostrils daily.    [provider]  fluticasone-salmeterol (ADVAIR HFA) 884-16 MCG/ACT inhaler 1 puff twice daily 07/16/18   [provider]  glucose blood (PRECISION QID TEST) test strip To be used to check glucose twice daily. E11.9 02/26/18   [provider]  hydrochlorothiazide (MICROZIDE) 12.5 MG capsule Take 12.5 mg by mouth daily.    [provider]  levothyroxine (SYNTHROID) 25 MCG tablet TAKE ONE TABLET BY MOUTH EVERY MORNING 05/05/19   [provider]  lisinopril (PRINIVIL,ZESTRIL) 10 MG tablet Take 20 mg by mouth daily. 03/11/17   [provider]  loratadine (CLARITIN) 10 MG tablet Take by mouth. 05/06/19 05/05/20  [provider]  memantine (NAMENDA) 5 MG tablet Take 5mg  a.m. for 2 weeks, then increase to 10mg  a.m. 02/01/19   [provider]  nitroGLYCERIN (NITROSTAT) 0.4 MG SL tablet Place under the tongue. 12/02/19 12/01/20  [provider]  pravastatin (PRAVACHOL) 40 MG tablet Take 40 mg by mouth daily. 07/08/18 10/03/20  [provider]  QUEtiapine (SEROQUEL) 25 MG tablet TAKE 1/2 TABLET BY MOUTH TWICE DAILY. TAKE AT LUNCH & DINNER TIME 05/05/19   [provider]    Allergies  Allergen Reactions   Memantine Other (See Comments)    Excessive sedation Excessive sedation    Family History  Problem Relation Age of Onset   Cancer Father     Social History Social History   Tobacco Use   Smoking status: Never   Smokeless tobacco: Never  Vaping Use   Vaping Use: Never used  Substance Use Topics   Alcohol use: No   Drug use: Never    Review of Systems Constitutional: Negative for fever. Cardiovascular: Negative for chest pain. Respiratory: Positive for shortness of breath and cough x1  week Gastrointestinal: Negative for abdominal pain Musculoskeletal: 1+ lower extremity edema bilaterally Neurological: Negative for headache All other ROS negative  ____________________________________________   PHYSICAL EXAM:  VITAL SIGNS: ED Triage Vitals  Enc Vitals Group     BP 01/13/21 0620 (!) 158/63     Pulse Rate 01/13/21 0618 65     Resp 01/13/21 0618 (!) 26     Temp 01/13/21 0618 98.5 F (36.9 C)     Temp src --      SpO2 01/13/21 0618 95 %     Weight 01/13/21 0618 154 lb 5.2 oz (70 kg)     Height 01/13/21 0618 5\' 6"  (1.676 m)     Head Circumference --      Peak Flow --      Pain Score --      Pain Loc --      Pain Edu? --      Excl. in [place]? --    Constitutional: Fatigued appearing but awakens easily to voice answers questions appropriately. Eyes: Normal exam ENT      Head: Normocephalic and atraumatic.  Mouth/Throat: Mucous membranes are moist. Cardiovascular: Normal rate, regular rhythm.  Respiratory: Slight rales bilaterally.  Occasional wet sounding cough. Gastrointestinal: Soft and nontender. No distention.  Musculoskeletal: Nontender with normal range of motion in all extremities.  1+ lower extremity edema bilaterally Neurologic:  Normal speech and language. No gross focal neurologic deficits Skin:  Skin is warm, dry and intact.  Psychiatric: Mood and affect are normal.   ____________________________________________    EKG  EKG viewed and interpreted by myself shows a normal sinus rhythm at 63 bpm with a narrow QRS, normal axis, normal intervals, no concerning ST changes.  ____________________________________________    RADIOLOGY  Chest x-ray shows cardiomegaly with vascular congestion  ____________________________________________   INITIAL IMPRESSION / ASSESSMENT AND PLAN / ED COURSE  Pertinent labs & imaging results that were available during my care of the patient were reviewed by me and considered in my medical decision making (see  chart for details).   Patient presents emergency department for worsening cough sputum production and shortness of breath over the past 1 week.  Chest x-ray shows vascular congestion, patient has mild rales bilaterally with 1+ lower extremity edema highly suspect CHF exacerbation/renal failure to be the cause of the patient's dyspnea.  Patient hypoxic on room air and placed on 2 L satting in the mid upper 90s.  Patient's hemoglobin 7.4 largely unchanged from historical values.  Patient is mildly hyponatremic at 127 likely related to fluid overload and I believe this will improve with diuresing.  Patient's kidney function has decreased from last values in September creatinine of 10 currently.  I clarified with the daughter that the patient did not want to be on dialysis.  We will admit to the hospital service for IV diuresing and continue to closely monitor.  Patient agreeable to plan of care.  Kristi Brown was evaluated in Emergency Department on 01/13/2021 for the symptoms described in the history of present illness. She was evaluated in the context of the global COVID-19 pandemic, which necessitated consideration that the patient might be at risk for infection with the SARS-CoV-2 virus that causes COVID-19. Institutional protocols and algorithms that pertain to the evaluation of patients at risk for COVID-19 are in a state of rapid change based on information released by regulatory bodies including the CDC and federal and state organizations. These policies and algorithms were followed during the patient's care in the ED.  ____________________________________________   FINAL CLINICAL IMPRESSION(S) / ED DIAGNOSES  Dyspnea Fluid overload Renal failure   Harvest Dark, MD 01/13/21 [phone]

## 2021-01-13 NOTE — ED Notes (Signed)
Patient repositioned at this time. Patient awakens to verbal stimuli and asking for family member. Informed family member went home. Patient appears to be sleeping again. Non-productive cough. No distress noted.

## 2021-01-13 NOTE — ED Notes (Signed)
Family at bedside feeding pt dinner tray

## 2021-01-13 NOTE — H&P (Signed)
H&P:    Kristi Brown   DUK:[id number] DOB: [birth year] DOA: 01/13/2021  PCP: Danae Orleans, MD  Chief Complaint: Shortness of breath, chest pain   History of Present Illness:    HPI: Kristi Brown Deman is a 87 y.o. female with a past medical history of, GERD, hypertension, hypothyroidism, chronic kidney disease stage V, HFpEF, dementia, history of CVA/TIA.  This patient presents from home via EMS for shortness of breath and chest pain.  Complaining of a wet cough.  When EMS arrived she was satting in the 67s.  Now on 2 L and saturating well.  Has sputum production as well.  No fevers or chills.  She can tell me her name and where she is.  She cannot tell me the date or who the president is.  No family present at bedside.  Unable to confirm if this is her baseline mental status.  The ED provider did speak with the daughter and the patient has been refusing dialysis.  Denies any chest pain upon my evaluation.  ED Course: Chest x-ray showed mild cardiomegaly with vascular congestion.  Now on 2 L oxygen supplementation.  EKG shows sinus rhythm 63 bpm.  Initial troponin was 13.  Sodium 127 and creatinine 10.0 with a BUN of 83.  She has been given Lasix 60 mg IV x1.  No urine output thus far.    ROS:   14 point review of systems is negative except for what is mentioned above in the HPI.   Past Medical History:   Past Medical History:  Diagnosis Date   Diabetes mellitus without complication (HCC)    GERD (gastroesophageal reflux disease)    Hypertension    Thyroid disease     Past Surgical History:   Past Surgical History:  Procedure Laterality Date   ABDOMINAL HYSTERECTOMY      Social History:   Social History   Socioeconomic History   Marital status: Widowed    Spouse name: Not on file   Number of children: Not on file   Years of education: Not on file   Highest education level: Not on file  Occupational History   Not on file  Tobacco Use   Smoking status: Never    Smokeless tobacco: Never  Vaping Use   Vaping Use: Never used  Substance and Sexual Activity   Alcohol use: No   Drug use: Never   Sexual activity: Not on file  Other Topics Concern   Not on file  Social History Narrative   Not on file   Social Determinants of Health   Financial Resource Strain: Not on file  Food Insecurity: Not on file  Transportation Needs: Not on file  Physical Activity: Not on file  Stress: Not on file  Social Connections: Not on file  Intimate Partner Violence: Not on file    Allergies:   Allergies  Allergen Reactions   Memantine Other (See Comments)    Excessive sedation Excessive sedation    Family History:   Family History  Problem Relation Age of Onset   Cancer Father      Current Medications:   Prior to Admission medications   Medication Sig Start Date End Date Taking? Authorizing Provider  albuterol (VENTOLIN HFA) 108 (90 Base) MCG/ACT inhaler Inhale 1-2 puffs into the lungs every 6 (six) hours as needed for wheezing or shortness of breath. 02/26/18 07/16/19  [provider]  Alcohol Swabs (B-D SINGLE USE SWABS REGULAR) PADS To be used for  checking glucose twice daily 07/22/18   [provider]  amLODipine (NORVASC) 10 MG tablet Take 10 mg by mouth daily. 11/12/18   [provider]  aspirin 81 MG EC tablet Take 81 mg by mouth daily.    [provider]  Assure Comfort Lancets 28G [place]  06/08/14   [provider]  calcium carbonate (OS-CAL - DOSED IN MG OF ELEMENTAL CALCIUM) 1250 (500 Ca) MG tablet Take 1 tablet (500 mg of elemental calcium total) by mouth 2 (two) times daily with a meal. 10/05/20   Jennye Boroughs, MD  carvedilol (COREG) 25 MG tablet Take 25 mg by mouth 2 (two) times daily with a meal. 07/08/18 10/03/20  [provider]  clopidogrel (PLAVIX) 75 MG tablet Take 75 mg by mouth daily. 04/25/20   [provider]  donepezil (ARICEPT) 5 MG tablet Take by mouth. 10/12/18 10/03/20   [provider]  esomeprazole (NEXIUM) 40 MG capsule Take 40 mg by mouth daily at 12 noon. 03/21/13 10/03/20  [provider]  fluticasone (FLONASE) 50 MCG/ACT nasal spray Place into both nostrils daily.    [provider]  fluticasone-salmeterol (ADVAIR HFA) 956-21 MCG/ACT inhaler 1 puff twice daily 07/16/18   [provider]  glucose blood (PRECISION QID TEST) test strip To be used to check glucose twice daily. E11.9 02/26/18   [provider]  hydrochlorothiazide (MICROZIDE) 12.5 MG capsule Take 12.5 mg by mouth daily.    [provider]  levothyroxine (SYNTHROID) 25 MCG tablet TAKE ONE TABLET BY MOUTH EVERY MORNING 05/05/19   [provider]  lisinopril (PRINIVIL,ZESTRIL) 10 MG tablet Take 20 mg by mouth daily. 03/11/17   [provider]  loratadine (CLARITIN) 10 MG tablet Take by mouth. 05/06/19 05/05/20  [provider]  memantine (NAMENDA) 5 MG tablet Take 5mg  a.m. for 2 weeks, then increase to 10mg  a.m. 02/01/19   [provider]  nitroGLYCERIN (NITROSTAT) 0.4 MG SL tablet Place under the tongue. 12/02/19 12/01/20  [provider]  pravastatin (PRAVACHOL) 40 MG tablet Take 40 mg by mouth daily. 07/08/18 10/03/20  [provider]  QUEtiapine (SEROQUEL) 25 MG tablet TAKE 1/2 TABLET BY MOUTH TWICE DAILY. TAKE AT LUNCH & DINNER TIME 05/05/19   [provider]     Physical Exam:   Vitals:   01/13/21 0620 01/13/21 0630 01/13/21 0700 01/13/21 0730  BP: (!) 158/63 (!) 153/52 131/60 (!) 155/53  Pulse:  64 66 65  Resp:  (!) 26 (!) 23 (!) 22  Temp:      SpO2:  99% 95% 97%  Weight:      Height:         General:  Appears calm and comfortable and is in NAD Cardiovascular:  RRR, no m/r/g.  Respiratory:   Scattered crackles Abdomen:  soft, NT, ND, NABS Skin:  no rash or induration seen on limited exam Musculoskeletal:  grossly normal tone BUE/BLE, good ROM, no bony abnormality Lower  extremity: 1+ pitting edema bilateral lower extremities.  Limited foot exam with no ulcerations.  2+ distal pulses. Psychiatric:  grossly normal mood and affect, speech fluent and appropriate, AOx2 to name and place Neurologic:  CN 2-12 grossly intact, moves all extremities in coordinated fashion, sensation intact    Data Review:    Radiological Exams on Admission: Independently reviewed - see discussion in A/P where applicable  DG Chest 1 View  Result Date: 01/13/2021 CLINICAL DATA:  Shortness of breath, cough EXAM: CHEST  1 VIEW  COMPARISON:  10/04/2020 FINDINGS: Heart is mildly enlarged. Aortic atherosclerosis. Mild vascular congestion. No confluent opacities, effusions or edema. No acute bony abnormality. IMPRESSION: Mild cardiomegaly, vascular congestion. Electronically Signed   By: Rolm Baptise M.D.   On: 01/13/2021 06:38    EKG: Independently reviewed.  SR 63 bpm   Labs on Admission: I have personally reviewed the available labs and imaging studies at the time of the admission.  Pertinent labs on Admission: Sodium 127, chloride 93, bicarb 21, BUN 83, creatinine 10.01, blood glucose 124, calcium 7.8, albumin 3.1, initial troponin 13, hemoglobin 7.4     Assessment/Plan:    Acute hypoxic respiratory failure: Currently on 2L and saturating well.  Chest x-ray shows vascular congestion and mild cardiomegaly.  We will start scheduled IV Lasix 80 mg twice daily per my conversation with nephrology.  Nephrology formally consulted.  The patient is refusing dialysis. Start Rocephin and Zithromax for empiric CAP coverage.  Monitor urine output closely as patient may not respond to IV diuretics and will need to strongly consider hospice.  Palliative care consulted.  AKI on CKD V: Plan as above.  Holding home hydrochlorothiazide, Cozaar and Lasix.  HFpEF: Acute exacerbation.  Previous ECHO on 9/22 showed LVEF 65-70% with G3DD.  Hold home Lasix.  Start IV Lasix as mentioned above.  Strict I's  and Os, daily weights, fluid restriction and salt restriction.  Continue home Coreg.  Hyponatremia: Likely hypervolemic.  Plan as above.  Repeat renal panel in the morning.  Dementia: Continue home Aricept and Seroquel  Hypertension: Continue home Norvasc, Coreg  Hyperlipidemia: Continue home Pravachol  GERD: Continue home Nexium  Hypothyroidism: Continue home Synthroid  History of CVA/TIA: Continue home aspirin, Plavix and Pravachol   Other information:    Level of Care: Cardiac telemetry DVT prophylaxis: Heparin subcu Code Status: Full code Consults: Nephrology Admission status: Inpatient   Leslee Home DO [facility]   How to contact the [facility] Attending or Consulting provider [place] or covering provider during after hours [place], for this patient?  Check the care team in [facility] and look for a) attending/consulting [facility] provider listed and b) the [facility] team listed Log into [email or web address] and use [place]'s universal password to access. If you do not have the password, please contact the hospital operator. Locate the [facility] provider you are looking for under [facility] and page to a number that you can be directly reached. If you still have difficulty reaching the provider, please page the [facility] (Director on Call) for the Hospitalists listed on amion for assistance.   01/13/2021, 8:15 AM

## 2021-01-13 NOTE — ED Triage Notes (Signed)
Pt arrives from home, per ems pt with wet cough and shob. Pt with sats on room air in 80s per ems on their arrival. Pt arrives on 3lpm with pox of 94%. Rhonchi auscultated per ems.

## 2021-01-13 NOTE — ED Notes (Signed)
Dr. Paduchowski at bedside.  

## 2021-01-13 NOTE — ED Notes (Signed)
Pt given apple sauce and ice water at this time.

## 2021-01-13 NOTE — Consult Note (Signed)
[facility]  CONSULT NOTE    Date: 01/13/2021                  Patient Name:  Kristi Brown  MRN: [id number]  DOB: 1929/04/20  Age / Sex: 87 y.o., female         PCP: Danae Orleans, MD                 Service Requesting Consult: Dr. Rowe Pavy                 Reason for Consult: Acute kidney injury            History of Present Illness: Ms. Kristi Brown presents with progression shortness of breath and chest discomfort for the last 1 week. Patient has weakness and cough. Patient claims to be taking all her medications. Patient does have dementia so unclear how reliable of a historian she is. Patient laying in bed with eyes closed but will wake up to answer questions appropriately.   Daughter at bedside, another daughter is on the phone. Patient was seen and evaluated by [facility], Dr. Ralph Dowdy, on 11/29 where patient was found to have worsening renal function. At that time and today, patient states she does not want dialysis. Family is in agreement.   Patient has not been following fluid or salt restriction. Seems that patient was on hydrochlorothiazide as her diuretic.   Patient complain of cough that is intermittently productive.    Medications: Outpatient medications: (Not in a hospital admission)   Current medications: Current Facility-Administered Medications  Medication Dose Route Frequency Provider Last Rate Last Admin   azithromycin (ZITHROMAX) tablet 500 mg  500 mg Oral Daily Rowe Pavy, Shayan S, DO       Followed by   Derrill Memo ON 01/14/2021] azithromycin (ZITHROMAX) tablet 250 mg  250 mg Oral Daily Anwar, Shayan S, DO       cefTRIAXone (ROCEPHIN) 1 g in sodium chloride 0.9 % 100 mL IVPB  1 g Intravenous Q24H Anwar, Shayan S, DO       furosemide (LASIX) injection 80 mg  80 mg Intravenous BID Anwar, Shayan S, DO       guaiFENesin (MUCINEX) 12 hr tablet 600 mg  600 mg Oral BID Anwar, Shayan S, DO       heparin injection 5,000 Units  5,000 Units  Subcutaneous Q8H Anwar, Shayan S, DO       ipratropium-albuterol (DUONEB) 0.5-2.5 (3) MG/3ML nebulizer solution 3 mL  3 mL Nebulization Q8H Imagene Sheller S, DO       Current Outpatient Medications  Medication Sig Dispense Refill   albuterol (VENTOLIN HFA) 108 (90 Base) MCG/ACT inhaler Inhale 1-2 puffs into the lungs every 6 (six) hours as needed for wheezing or shortness of breath.     Alcohol Swabs (B-D SINGLE USE SWABS REGULAR) PADS To be used for checking glucose twice daily     amLODipine (NORVASC) 10 MG tablet Take 10 mg by mouth daily.     aspirin 81 MG EC tablet Take 81 mg by mouth daily.     Assure Comfort Lancets 28G MISC      calcium carbonate (OS-CAL - DOSED IN MG OF ELEMENTAL CALCIUM) 1250 (500 Ca) MG tablet Take 1 tablet (500 mg of elemental calcium total) by mouth 2 (two) times daily with a meal.     carvedilol (COREG) 25 MG tablet Take 25 mg by mouth 2 (two) times daily with a meal.  clopidogrel (PLAVIX) 75 MG tablet Take 75 mg by mouth daily.     donepezil (ARICEPT) 5 MG tablet Take by mouth.     esomeprazole (NEXIUM) 40 MG capsule Take 40 mg by mouth daily at 12 noon.     fluticasone (FLONASE) 50 MCG/ACT nasal spray Place into both nostrils daily.     fluticasone-salmeterol (ADVAIR HFA) 115-21 MCG/ACT inhaler 1 puff twice daily     glucose blood (PRECISION QID TEST) test strip To be used to check glucose twice daily. E11.9     hydrochlorothiazide (MICROZIDE) 12.5 MG capsule Take 12.5 mg by mouth daily.     levothyroxine (SYNTHROID) 25 MCG tablet TAKE ONE TABLET BY MOUTH EVERY MORNING     lisinopril (PRINIVIL,ZESTRIL) 10 MG tablet Take 20 mg by mouth daily.  1   loratadine (CLARITIN) 10 MG tablet Take by mouth.     memantine (NAMENDA) 5 MG tablet Take 5mg  a.m. for 2 weeks, then increase to 10mg  a.m.     nitroGLYCERIN (NITROSTAT) 0.4 MG SL tablet Place under the tongue.     pravastatin (PRAVACHOL) 40 MG tablet Take 40 mg by mouth daily.     QUEtiapine (SEROQUEL) 25 MG  tablet TAKE 1/2 TABLET BY MOUTH TWICE DAILY. TAKE AT LUNCH & DINNER TIME        Allergies: Allergies  Allergen Reactions   Memantine Other (See Comments)    Excessive sedation Excessive sedation      Past Medical History: Past Medical History:  Diagnosis Date   Diabetes mellitus without complication (HCC)    GERD (gastroesophageal reflux disease)    Hypertension    Thyroid disease      Past Surgical History: Past Surgical History:  Procedure Laterality Date   ABDOMINAL HYSTERECTOMY       Family History: Family History  Problem Relation Age of Onset   Cancer Father      Social History: Social History   Socioeconomic History   Marital status: Widowed    Spouse name: Not on file   Number of children: Not on file   Years of education: Not on file   Highest education level: Not on file  Occupational History   Not on file  Tobacco Use   Smoking status: Never   Smokeless tobacco: Never  Vaping Use   Vaping Use: Never used  Substance and Sexual Activity   Alcohol use: No   Drug use: Never   Sexual activity: Not on file  Other Topics Concern   Not on file  Social History Narrative   Not on file   Social Determinants of Health   Financial Resource Strain: Not on file  Food Insecurity: Not on file  Transportation Needs: Not on file  Physical Activity: Not on file  Stress: Not on file  Social Connections: Not on file  Intimate Partner Violence: Not on file     Review of Systems: Review of Systems  Unable to perform ROS: Dementia   Vital Signs: Blood pressure 139/63, pulse 65, temperature 98.5 F (36.9 C), resp. rate 17, height 5\' 6"  (1.676 m), weight 70 kg, SpO2 100 %.  Weight trends: Filed Weights   01/13/21 0618  Weight: 70 kg    Physical Exam: General: NAD, laying in bed  Head: Normocephalic, atraumatic. Moist oral mucosal membranes  Eyes: Anicteric, PERRL  Neck: Supple, trachea midline  Lungs:  Diminished bilaterally. 3 L [place] O2   Heart: Regular rate and rhythm  Abdomen:  Soft, nontender,   Extremities:  +  peripheral edema.  Neurologic: Nonfocal, moving all four extremities, alert to self, time and place  Skin: No lesions  Access: none     Lab results: Basic Metabolic Panel: Recent Labs  Lab 01/13/21 0622  NA 127*  K 5.1  CL 93*  CO2 21*  GLUCOSE 124*  BUN 83*  CREATININE 10.01*  CALCIUM 7.8*    Liver Function Tests: Recent Labs  Lab 01/13/21 0622  AST 11*  ALT 9  ALKPHOS 65  BILITOT 1.2  PROT 6.9  ALBUMIN 3.1*   No results for input(s): LIPASE, AMYLASE in the last 168 hours. No results for input(s): AMMONIA in the last 168 hours.  CBC: Recent Labs  Lab 01/13/21 0622  WBC 7.6  NEUTROABS 5.3  HGB 7.4*  HCT 23.0*  MCV 99.1  PLT 229    Cardiac Enzymes: No results for input(s): CKTOTAL, CKMB, CKMBINDEX, TROPONINI in the last 168 hours.  BNP: Invalid input(s): POCBNP  CBG: No results for input(s): GLUCAP in the last 168 hours.  Microbiology: Results for orders placed or performed during the hospital encounter of 01/13/21  Resp Panel by RT-PCR (Flu A&B, Covid) Nasopharyngeal Swab     Status: None   Collection Time: 01/13/21  6:24 AM   Specimen: Nasopharyngeal Swab; Nasopharyngeal(NP) swabs in vial transport medium  Result Value Ref Range Status   SARS Coronavirus 2 by RT PCR NEGATIVE NEGATIVE Final    Comment: (NOTE) SARS-CoV-2 target nucleic acids are NOT DETECTED.  The SARS-CoV-2 RNA is generally detectable in upper respiratory specimens during the acute phase of infection. The lowest concentration of SARS-CoV-2 viral copies this assay can detect is 138 copies/mL. A negative result does not preclude SARS-Cov-2 infection and should not be used as the sole basis for treatment or other patient management decisions. A negative result may occur with  improper specimen collection/handling, submission of specimen other than nasopharyngeal swab, presence of viral mutation(s)  within the areas targeted by this assay, and inadequate number of viral copies(<138 copies/mL). A negative result must be combined with clinical observations, patient history, and epidemiological information. The expected result is Negative.  Fact Sheet for Patients:  EntrepreneurPulse.com.au  Fact Sheet for Healthcare Providers:  IncredibleEmployment.be  This test is no t yet approved or cleared by the [place] FDA and  has been authorized for detection and/or diagnosis of SARS-CoV-2 by FDA under an Emergency Use Authorization (EUA). This EUA will remain  in effect (meaning this test can be used) for the duration of the COVID-19 declaration under Section 564(b)(1) of the Act, 21 U.S.C.section 360bbb-3(b)(1), unless the authorization is terminated  or revoked sooner.       Influenza A by PCR NEGATIVE NEGATIVE Final   Influenza B by PCR NEGATIVE NEGATIVE Final    Comment: (NOTE) The Xpert Xpress SARS-CoV-2/FLU/RSV plus assay is intended as an aid in the diagnosis of influenza from Nasopharyngeal swab specimens and should not be used as a sole basis for treatment. Nasal washings and aspirates are unacceptable for Xpert Xpress SARS-CoV-2/FLU/RSV testing.  Fact Sheet for Patients: EntrepreneurPulse.com.au  Fact Sheet for Healthcare Providers: IncredibleEmployment.be  This test is not yet approved or cleared by the [place] FDA and has been authorized for detection and/or diagnosis of SARS-CoV-2 by FDA under an Emergency Use Authorization (EUA). This EUA will remain in effect (meaning this test can be used) for the duration of the COVID-19 declaration under Section 564(b)(1) of the Act, 21 U.S.C. section 360bbb-3(b)(1), unless the authorization is terminated or revoked.  Performed at [facility], [address] [place], [place] [address]     Coagulation Studies: No results for  input(s): LABPROT, INR in the last 72 hours.  Urinalysis: Recent Labs    01/13/21 0806  COLORURINE YELLOW  LABSPEC 1.020  PHURINE 5.0  GLUCOSEU NEGATIVE  HGBUR NEGATIVE  BILIRUBINUR NEGATIVE  KETONESUR NEGATIVE  PROTEINUR >300*  NITRITE 0.2*  LEUKOCYTESUR TRACE*      Imaging: DG Chest 1 View  Result Date: 01/13/2021 CLINICAL DATA:  Shortness of breath, cough EXAM: CHEST  1 VIEW COMPARISON:  10/04/2020 FINDINGS: Heart is mildly enlarged. Aortic atherosclerosis. Mild vascular congestion. No confluent opacities, effusions or edema. No acute bony abnormality. IMPRESSION: Mild cardiomegaly, vascular congestion. Electronically Signed   By: Rolm Baptise M.D.   On: 01/13/2021 06:38     Assessment & Plan: Ms. DENEICE WACK is a 87 y.o. black female with diabetes mellitus type II, hypertension, GERD, hypothyroidism, macular degeneration, demenita, CVA/TIA, ITP, hyperlipidemia, and history of carcinoid tumor who was admitted to [facility] on 01/13/2021 for Acute kidney injury superimposed on chronic kidney disease ([place]) [N17.9, N18.9]  Acute kidney injury on chronic kidney disease stage V versus progression to end stage renal disease. Creatinine baseline of 6.13, GFR of 6 on 10/05/20. Followed by [facility], Dr. Ralph Dowdy.  - most likely progression of disease. - holding lisinopril - discussed dialysis with family. Patient made it clear that she did not want dialysis. However patient's family wants to keep her at [facility].  - Recommend palliative care consultation.  Hypertension and acute exacerbation of diastolic congestive heart failure. Echocardiogram on 10/03/20. Home regimen of amlodipine, carvedilol, lisinopril and hydrochlorothiazide.  - do not recommend resuming hydrochlorothiazide.  - IV furosemide 80mg  q12 ordered.   Anemia of chronic kidney disease: hemoglobin 7.4. No history of ESA use.  - check iron studies.      LOS: 0 Paizlee Kinder 12/25/202210:05 AM

## 2021-01-13 NOTE — ED Notes (Signed)
Patient resting in bed with eyes closed. Resp even, unlabored on 2L per  with non-productive cough. Able to state name and responds to verbal stimuli, but too drowsy to take oral medication at this time.

## 2021-01-13 NOTE — ED Notes (Signed)
Nephrologist at bedside

## 2021-01-14 DIAGNOSIS — N189 Chronic kidney disease, unspecified: Secondary | ICD-10-CM | POA: Diagnosis not present

## 2021-01-14 DIAGNOSIS — Z7189 Other specified counseling: Secondary | ICD-10-CM | POA: Diagnosis not present

## 2021-01-14 DIAGNOSIS — N179 Acute kidney failure, unspecified: Secondary | ICD-10-CM | POA: Diagnosis not present

## 2021-01-14 LAB — COMPREHENSIVE METABOLIC PANEL
ALT: 9 U/L (ref 0–44)
AST: 12 U/L — ABNORMAL LOW (ref 15–41)
Albumin: 2.8 g/dL — ABNORMAL LOW (ref 3.5–5.0)
Alkaline Phosphatase: 67 U/L (ref 38–126)
Anion gap: 13 (ref 5–15)
BUN: 87 mg/dL — ABNORMAL HIGH (ref 8–23)
CO2: 21 mmol/L — ABNORMAL LOW (ref 22–32)
Calcium: 7.3 mg/dL — ABNORMAL LOW (ref 8.9–10.3)
Chloride: 98 mmol/L (ref 98–111)
Creatinine, Ser: 10.7 mg/dL — ABNORMAL HIGH (ref 0.44–1.00)
GFR, Estimated: 3 mL/min — ABNORMAL LOW (ref >60)
Glucose, Bld: 77 mg/dL (ref 70–99)
Potassium: 4.8 mmol/L (ref 3.5–5.1)
Sodium: 132 mmol/L — ABNORMAL LOW (ref 135–145)
Total Bilirubin: 1.1 mg/dL (ref 0.3–1.2)
Total Protein: 6.3 g/dL — ABNORMAL LOW (ref 6.5–8.1)

## 2021-01-14 LAB — CBC
HCT: 21 % — ABNORMAL LOW (ref 36.0–46.0)
Hemoglobin: 7 g/dL — ABNORMAL LOW (ref 12.0–15.0)
MCH: 32.6 pg (ref 26.0–34.0)
MCHC: 33.3 g/dL (ref 30.0–36.0)
MCV: 97.7 fL (ref 80.0–100.0)
Platelets: 235 10*3/uL (ref 150–400)
RBC: 2.15 MIL/uL — ABNORMAL LOW (ref 3.87–5.11)
RDW: 12.7 % (ref 11.5–15.5)
WBC: 7.1 10*3/uL (ref 4.0–10.5)
nRBC: 0 % (ref 0.0–0.2)

## 2021-01-14 LAB — HEPATITIS B SURFACE ANTIBODY,QUALITATIVE: Hep B S Ab: NONREACTIVE

## 2021-01-14 LAB — IRON AND TIBC
Iron: 33 ug/dL (ref 28–170)
Saturation Ratios: 18 % (ref 10.4–31.8)
TIBC: 188 ug/dL — ABNORMAL LOW (ref 250–450)
UIBC: 155 ug/dL

## 2021-01-14 LAB — HEPATITIS B CORE ANTIBODY, TOTAL: Hep B Core Total Ab: NONREACTIVE

## 2021-01-14 LAB — PHOSPHORUS: Phosphorus: 9 mg/dL — ABNORMAL HIGH (ref 2.5–4.6)

## 2021-01-14 LAB — HEPATITIS B SURFACE ANTIGEN: Hepatitis B Surface Ag: NONREACTIVE

## 2021-01-14 LAB — FERRITIN: Ferritin: 413 ng/mL — ABNORMAL HIGH (ref 11–307)

## 2021-01-14 LAB — HEPATITIS B CORE ANTIBODY, IGM: Hep B C IgM: NONREACTIVE

## 2021-01-14 MED ORDER — ACETAMINOPHEN 160 MG/5ML PO SOLN
1000.0000 mg | Freq: Four times a day (QID) | ORAL | Status: DC | PRN
  Administered 2021-01-15: 15:00:00 1000 mg via ORAL
  Filled 2021-01-14 (×2): qty 40.6

## 2021-01-14 MED ORDER — ACETAMINOPHEN 160 MG/5ML PO SOLN
1000.0000 mg | Freq: Four times a day (QID) | ORAL | Status: DC | PRN
  Filled 2021-01-14: qty 40.6

## 2021-01-14 MED ORDER — LIDOCAINE 5 % EX PTCH
2.0000 | MEDICATED_PATCH | CUTANEOUS | Status: DC
  Administered 2021-01-14 – 2021-01-19 (×5): 2 via TRANSDERMAL
  Filled 2021-01-14 (×9): qty 2

## 2021-01-14 MED ORDER — GUAIFENESIN 100 MG/5ML PO LIQD
5.0000 mL | ORAL | Status: DC | PRN
  Administered 2021-01-15: 06:00:00 5 mL via ORAL
  Filled 2021-01-14: qty 5

## 2021-01-14 MED ORDER — ACETAMINOPHEN 500 MG PO TABS
1000.0000 mg | ORAL_TABLET | Freq: Three times a day (TID) | ORAL | Status: DC | PRN
  Administered 2021-01-14: 08:00:00 1000 mg via ORAL
  Filled 2021-01-14 (×2): qty 2

## 2021-01-14 MED ORDER — DICLOFENAC SODIUM 1 % EX GEL
4.0000 g | Freq: Four times a day (QID) | CUTANEOUS | Status: DC | PRN
  Administered 2021-01-14: 18:00:00 4 g via TOPICAL
  Filled 2021-01-14: qty 100

## 2021-01-14 NOTE — Progress Notes (Signed)
Nutrition Brief Note  Chart reviewed. Per palliative care notes, continue with current interventions for now. Per nephrology notes, pt does not desire HD and would like to go home with hospice.  No further nutrition interventions planned at this time.  Please re-consult as needed.   Loistine Chance, RD, LDN, [place] Registered Dietitian II Certified Diabetes Care and Education Specialist Please refer to [facility] for RD and/or RD on-call/weekend/after hours pager

## 2021-01-14 NOTE — Evaluation (Signed)
Clinical/Bedside Swallow Evaluation Patient Details  Name: Kristi Brown MRN: [id number] Date of Birth: Nov 09, 1929  Today's Date: 01/14/2021 Time: SLP Start Time (ACUTE ONLY): 0955 SLP Stop Time (ACUTE ONLY): 1020 SLP Time Calculation (min) (ACUTE ONLY): 25 min  Past Medical History:  Past Medical History:  Diagnosis Date   Diabetes mellitus without complication (HCC)    GERD (gastroesophageal reflux disease)    Hypertension    Thyroid disease    Past Surgical History:  Past Surgical History:  Procedure Laterality Date   ABDOMINAL HYSTERECTOMY     HPI:  Per 23 H&P "HPI: Kristi Brown is a 87 y.o. female with a past medical history of, GERD, hypertension, hypothyroidism, chronic kidney disease stage V, HFpEF, dementia, history of CVA/TIA.     This patient presents from home via EMS for shortness of breath and chest pain.  Complaining of a wet cough.  When EMS arrived she was satting in the 30s.  Now on 2 L and saturating well.  Has sputum production as well.  No fevers or chills.  She can tell me her name and where she is.  She cannot tell me the date or who the president is.  No family present at bedside.  Unable to confirm if this is her baseline mental status.  The ED provider did speak with the daughter and the patient has been refusing dialysis.  Denies any chest pain upon my evaluation.     ED Course: Chest x-ray showed mild cardiomegaly with vascular congestion.  Now on 2 L oxygen supplementation.  EKG shows sinus rhythm 63 bpm.  Initial troponin was 13.  Sodium 127 and creatinine 10.0 with a BUN of 83.  She has been given Lasix 60 mg IV x1.  No urine output thus far."    Assessment / Plan / Recommendation  Clinical Impression  Pt seen for clinical swallowing evaluation. Pt resting upon SLP entrance to room. Pt roused easily to voice. Pt alert, pleasant, and cooperative. Needs assistance with positioning. Pt's daughter and daughter's significant other at bedside. Pt's daughter  denies pt with any difficulty swallowing. Cleared with RN who noted pt with some "pocketing" this AM. When asked, daughter noted pt with prolonged mastication due to dental status. Reporting pt wears dentures which are not present at this time, but are being brought in by pt's daughter today.   Pt on 3L/min O2 via [place]. Baseline congested/wet cough noted. CXR 01/13/21 "Mild cardiomegaly, vascular congestion." Current temp and WBC WNL.  Pt benefits from assistance/cueing for upright positioning. Pt also required set up/steadying assistance for feeding. Pt given trials of solids, pureed, and thin liquids. Pt demonstrated a grossly functional oral swallow c/b mildly prolonged mastication of solids and trace lingual residual with solids which cleared with liquid wash. Oral phase was [facility] for other consistencies. Prolonged mastication likely due to dental status. Pharyngeal swallow appeared [facility] per clinical assessment. No overt or subtle s/sx pharyngeal dysphagia noted across trials. To palpation, seemingly timely swallow initiation and seemingly adequate laryngeal elevation. No change to vocal quality noted across trials.   Pt is at mildly increased risk for aspiration/aspiration PNA given dental status, need for assistance with feeding, respiratory status, and multiple medical comorbidities. Risk appears to be reduced with diet modifications and safe swallowing strategies/aspiration precautions outlined below.  Recommend a mech soft diet with thin liquids with safe swallowing strategies/aspiration precautions as outlined below. Altered consistency diet recommended to promote oral efficiency.   Pt, pt's daughter, and RN  made aware of results, recommendations, and SLP POC. Pt and daughter verbalized understanding/agreement.   SLP to sign off as pt has no acute SLP needs at this time.   SLP Visit Diagnosis: Dysphagia, oral phase (R13.11)    Aspiration Risk  Mild aspiration risk    Diet Recommendation  Dysphagia 3 (Mech soft);Thin liquid   Medication Administration: Whole meds with liquid (vs with puree; as tolerated) Supervision: Staff to assist with self feeding Compensations: Slow rate;Small sips/bites;Follow solids with liquid Postural Changes: Seated upright at 90 degrees;Remain upright for at least 30 minutes after po intake    Other  Recommendations Oral Care Recommendations: Oral care BID;Staff/trained caregiver to provide oral care    Recommendations for follow up therapy are one component of a multi-disciplinary discharge planning process, led by the attending physician.  Recommendations may be updated based on patient status, additional functional criteria and insurance authorization.  Follow up Recommendations No SLP follow up      Assistance Recommended at Discharge  (anticipate need for some level of assistance at d/c)  Functional Status Assessment Patient has not had a recent decline in their functional status (for swallowing)         Prognosis Prognosis for Safe Diet Advancement: Fair (overall medical prognosis)      Swallow Study   General Date of Onset: 01/13/21 HPI: Per 59 H&P "HPI: Kristi Brown is a 87 y.o. female with a past medical history of, GERD, hypertension, hypothyroidism, chronic kidney disease stage V, HFpEF, dementia, history of CVA/TIA.     This patient presents from home via EMS for shortness of breath and chest pain.  Complaining of a wet cough.  When EMS arrived she was satting in the 52s.  Now on 2 L and saturating well.  Has sputum production as well.  No fevers or chills.  She can tell me her name and where she is.  She cannot tell me the date or who the president is.  No family present at bedside.  Unable to confirm if this is her baseline mental status.  The ED provider did speak with the daughter and the patient has been refusing dialysis.  Denies any chest pain upon my evaluation.     ED Course: Chest x-ray showed mild cardiomegaly with  vascular congestion.  Now on 2 L oxygen supplementation.  EKG shows sinus rhythm 63 bpm.  Initial troponin was 13.  Sodium 127 and creatinine 10.0 with a BUN of 83.  She has been given Lasix 60 mg IV x1.  No urine output thus far." Type of Study: Bedside Swallow Evaluation Previous Swallow Assessment: unknown Diet Prior to this Study: Regular;Thin liquids Temperature Spikes Noted:  (99 overnight) Respiratory Status: Nasal cannula (3L/min) History of Recent Intubation: No Behavior/Cognition: Alert;Cooperative;Pleasant mood Oral Cavity Assessment: Within Functional Limits Oral Care Completed by SLP: Recent completion by staff Oral Cavity - Dentition: Dentures, top;Dentures, bottom;Dentures, not available Vision: Functional for self-feeding Self-Feeding Abilities: Needs assist;Needs set up;Able to feed self Patient Positioning: Upright in bed Baseline Vocal Quality: Normal Volitional Cough: Strong Volitional Swallow: Able to elicit    Oral/Motor/Sensory Function  WFL     Thin Liquid Thin Liquid: Within functional limits Presentation: Self Fed;Straw    Puree Puree: Within functional limits Presentation: Self Fed (some set up/steadying assistance)   Solid     Solid: Within functional limits Presentation: Self Fed Other Comments: mildly prolonged, but functional, mastication; trace lingual residual which cleared with liquid wash  Cherrie Gauze, M.S., [facility] [phone] ([place])   Kristi Brown 01/14/2021,11:23 AM

## 2021-01-14 NOTE — Plan of Care (Signed)
°  Problem: Clinical Measurements: Goal: Will remain free from infection Outcome: Progressing   Problem: Elimination: Goal: Will not experience complications related to bowel motility Outcome: Progressing Goal: Will not experience complications related to urinary retention Outcome: Progressing   Problem: Pain Managment: Goal: General experience of comfort will improve Outcome: Progressing   Problem: Safety: Goal: Ability to remain free from injury will improve Outcome: Progressing   Problem: Skin Integrity: Goal: Risk for impaired skin integrity will decrease Outcome: Progressing   Problem: Education: Goal: Knowledge of General Education information will improve Description: Including pain rating scale, medication(s)/side effects and non-pharmacologic comfort measures Outcome: Not Progressing   Problem: Health Behavior/Discharge Planning: Goal: Ability to manage health-related needs will improve Outcome: Not Progressing   Problem: Clinical Measurements: Goal: Ability to maintain clinical measurements within normal limits will improve Outcome: Not Progressing Goal: Diagnostic test results will improve Outcome: Not Progressing Goal: Respiratory complications will improve Outcome: Not Progressing Goal: Cardiovascular complication will be avoided Outcome: Not Progressing   Problem: Activity: Goal: Risk for activity intolerance will decrease Outcome: Not Progressing   Problem: Nutrition: Goal: Adequate nutrition will be maintained Outcome: Not Progressing   Problem: Coping: Goal: Level of anxiety will decrease Outcome: Not Progressing

## 2021-01-14 NOTE — Progress Notes (Signed)
[facility]  ROUNDING NOTE   Subjective:   Patient is more awake and alert this morning. Able to answer questions.  Daughter and son-in-law at bedside.   Na 132 (127)  UOP 257mL recorded  Objective:  Vital signs in last 24 hours:  Temp:  [97.8 F (36.6 C)-99 F (37.2 C)] 98.6 F (37 C) (12/26 0731) Pulse Rate:  [54-75] 70 (12/26 0731) Resp:  [16-20] 17 (12/26 0731) BP: (114-155)/(40-81) 136/40 (12/26 0731) SpO2:  [93 %-100 %] 94 % (12/26 0731) Weight:  [68.1 kg] 68.1 kg (12/26 0126)  Weight change: -1.9 kg Filed Weights   01/13/21 0618 01/14/21 0126  Weight: 70 kg 68.1 kg    Intake/Output: I/O last 3 completed shifts: In: -  Out: 250 [Urine:250]   Intake/Output this shift:  No intake/output data recorded.  Physical Exam: General: NAD, laying in bed  Head: Normocephalic, atraumatic. Moist oral mucosal membranes  Eyes: Anicteric, PERRL  Neck: Supple, trachea midline  Lungs:  Bilateral crackles, 3L [place]  Heart: Regular rate and rhythm  Abdomen:  Soft, nontender,   Extremities:  no peripheral edema.  Neurologic: Nonfocal, moving all four extremities  Skin: No lesions  Access: none    Basic Metabolic Panel: Recent Labs  Lab 01/13/21 0622 01/14/21 0619  NA 127* 132*  K 5.1 4.8  CL 93* 98  CO2 21* 21*  GLUCOSE 124* 77  BUN 83* 87*  CREATININE 10.01* 10.70*  CALCIUM 7.8* 7.3*  PHOS  --  9.0*    Liver Function Tests: Recent Labs  Lab 01/13/21 0622 01/14/21 0619  AST 11* 12*  ALT 9 9  ALKPHOS 65 67  BILITOT 1.2 1.1  PROT 6.9 6.3*  ALBUMIN 3.1* 2.8*   No results for input(s): LIPASE, AMYLASE in the last 168 hours. No results for input(s): AMMONIA in the last 168 hours.  CBC: Recent Labs  Lab 01/13/21 0622 01/14/21 0619  WBC 7.6 7.1  NEUTROABS 5.3  --   HGB 7.4* 7.0*  HCT 23.0* 21.0*  MCV 99.1 97.7  PLT 229 235    Cardiac Enzymes: No results for input(s): CKTOTAL, CKMB, CKMBINDEX, TROPONINI in the last 168  hours.  BNP: Invalid input(s): POCBNP  CBG: No results for input(s): GLUCAP in the last 168 hours.  Microbiology: Results for orders placed or performed during the hospital encounter of 01/13/21  Resp Panel by RT-PCR (Flu A&B, Covid) Nasopharyngeal Swab     Status: None   Collection Time: 01/13/21  6:24 AM   Specimen: Nasopharyngeal Swab; Nasopharyngeal(NP) swabs in vial transport medium  Result Value Ref Range Status   SARS Coronavirus 2 by RT PCR NEGATIVE NEGATIVE Final    Comment: (NOTE) SARS-CoV-2 target nucleic acids are NOT DETECTED.  The SARS-CoV-2 RNA is generally detectable in upper respiratory specimens during the acute phase of infection. The lowest concentration of SARS-CoV-2 viral copies this assay can detect is 138 copies/mL. A negative result does not preclude SARS-Cov-2 infection and should not be used as the sole basis for treatment or other patient management decisions. A negative result may occur with  improper specimen collection/handling, submission of specimen other than nasopharyngeal swab, presence of viral mutation(s) within the areas targeted by this assay, and inadequate number of viral copies(<138 copies/mL). A negative result must be combined with clinical observations, patient history, and epidemiological information. The expected result is Negative.  Fact Sheet for Patients:  EntrepreneurPulse.com.au  Fact Sheet for Healthcare Providers:  IncredibleEmployment.be  This test is no t yet  approved or cleared by the [place] and  has been authorized for detection and/or diagnosis of SARS-CoV-2 by FDA under an Emergency Use Authorization (EUA). This EUA will remain  in effect (meaning this test can be used) for the duration of the COVID-19 declaration under Section 564(b)(1) of the Act, 21 U.S.C.section 360bbb-3(b)(1), unless the authorization is terminated  or revoked sooner.       Influenza A by PCR  NEGATIVE NEGATIVE Final   Influenza B by PCR NEGATIVE NEGATIVE Final    Comment: (NOTE) The Xpert Xpress SARS-CoV-2/FLU/RSV plus assay is intended as an aid in the diagnosis of influenza from Nasopharyngeal swab specimens and should not be used as a sole basis for treatment. Nasal washings and aspirates are unacceptable for Xpert Xpress SARS-CoV-2/FLU/RSV testing.  Fact Sheet for Patients: EntrepreneurPulse.com.au  Fact Sheet for Healthcare Providers: IncredibleEmployment.be  This test is not yet approved or cleared by the [place] FDA and has been authorized for detection and/or diagnosis of SARS-CoV-2 by FDA under an Emergency Use Authorization (EUA). This EUA will remain in effect (meaning this test can be used) for the duration of the COVID-19 declaration under Section 564(b)(1) of the Act, 21 U.S.C. section 360bbb-3(b)(1), unless the authorization is terminated or revoked.  Performed at [facility], [address] [place], [place] [address]     Coagulation Studies: No results for input(s): LABPROT, INR in the last 72 hours.  Urinalysis: Recent Labs    01/13/21 0806  COLORURINE YELLOW  LABSPEC 1.020  PHURINE 5.0  GLUCOSEU NEGATIVE  HGBUR NEGATIVE  BILIRUBINUR NEGATIVE  KETONESUR NEGATIVE  PROTEINUR >300*  NITRITE 0.2*  LEUKOCYTESUR TRACE*      Imaging: DG Chest 1 View  Result Date: 01/13/2021 CLINICAL DATA:  Shortness of breath, cough EXAM: CHEST  1 VIEW COMPARISON:  10/04/2020 FINDINGS: Heart is mildly enlarged. Aortic atherosclerosis. Mild vascular congestion. No confluent opacities, effusions or edema. No acute bony abnormality. IMPRESSION: Mild cardiomegaly, vascular congestion. Electronically Signed   By: Rolm Baptise M.D.   On: 01/13/2021 06:38     Medications:     amLODipine  10 mg Oral Daily   aspirin EC  81 mg Oral Daily   calcium carbonate  1 tablet Oral BID WC   carvedilol  25 mg Oral BID  WC   donepezil  10 mg Oral q morning   fluticasone furoate-vilanterol  1 puff Inhalation Daily   furosemide  80 mg Intravenous BID   guaiFENesin  600 mg Oral BID   heparin  5,000 Units Subcutaneous Q8H   ipratropium-albuterol  3 mL Nebulization Q8H   levothyroxine  25 mcg Oral QAC breakfast   pantoprazole  80 mg Oral Q1200   pravastatin  40 mg Oral QPM   QUEtiapine  12.5 mg Oral BID   acetaminophen, albuterol, loratadine  Assessment/ Plan:  Ms. TERASA ORSINI is a 87 y.o. black female with diabetes mellitus type II, hypertension, GERD, hypothyroidism, macular degeneration, demenita, CVA/TIA, ITP, hyperlipidemia, and history of carcinoid tumor who was admitted to [facility] on 01/13/2021 for Hypervolemia, unspecified hypervolemia type [E87.70] Acute kidney injury superimposed on chronic kidney disease ([place]) [N17.9, N18.9] Acute renal failure superimposed on chronic kidney disease, unspecified CKD stage, unspecified acute renal failure type ([place]) [N17.9, N18.9]   Acute kidney injury on chronic kidney disease stage V versus progression to end stage renal disease. Creatinine baseline of 6.13, GFR of 6 on 10/05/20. Followed by [facility], Dr. Ralph Dowdy. Most likely progression of disease. -  holding lisinopril - discussed dialysis with patient and family. Patient made it clear that she did not want dialysis.  - Pending palliative care consultation.   Hypertension and acute exacerbation of diastolic congestive heart failure. Echocardiogram on 10/03/20. Home regimen of amlodipine, carvedilol, lisinopril and hydrochlorothiazide.  - do not recommend resuming hydrochlorothiazide.  - holding lisinopril - Continue amlodipine and carvedilol.  - Continue IV furosemide 80mg  q12.    Anemia of chronic kidney disease: hemoglobin 7. No history of ESA use. Iron level are at goal. Low threshold for PRBC transfusion.     LOS: 1 Hendry Speas 12/26/202210:57 AM

## 2021-01-14 NOTE — Progress Notes (Addendum)
PROGRESS NOTE    Kristi Brown  TKP:[id number] DOB: 09-11-1929 DOA: 01/13/2021 PCP: Danae Orleans, MD  [id number]   Assessment & Plan:   Principal Problem:   Acute kidney injury superimposed on chronic kidney disease ([place])   Kristi Brown is a 87 y.o. female with a past medical history of, GERD, hypertension, hypothyroidism, chronic kidney disease stage V, HFpEF, dementia, history of CVA/TIA.   This patient presents from home via EMS for shortness of breath and chest pain.  Complaining of a wet cough.  When EMS arrived she was satting in the 77s.  Now on 2 L and saturating well.    Acute hypoxic respiratory failure:  Currently on 2L and saturating well.  Chest x-ray shows vascular congestion and mild cardiomegaly. --started IV Lasix 80 mg BID --Started Rocephin and Zithromax for empiric CAP coverage.   Plan: --cont IV Lasix 80 BID --d/c abx since no strong evidence of PNA   AKI on CKD V:  --pt confirmed she doesn't want dialysis --nephrology consulted --hold home HCTZ, Cozaar --palliative consult   HFpEF: Acute exacerbation.  Previous ECHO on 9/22 showed LVEF 65-70% with G3DD.   --started on IV lasix on admission Plan: --cont IV Lasix 80 BID   Hyponatremia:  Likely hypervolemic.   --diurese   Dementia:  Continue home Aricept and Seroquel   Hypertension:  Continue home Norvasc, Coreg --cont IV lasix   Hyperlipidemia:  Continue home Pravachol   GERD:  --cont home PPI   Hypothyroidism:  Continue home Synthroid   History of CVA/TIA:  Continue home aspirin, Plavix and Pravachol    DVT prophylaxis: Heparin SQ Code Status: Full code  Family Communication: daughter updated at bedside today  Level of care: Telemetry Cardiac Dispo:   The patient is from: home Anticipated d/c is to: undetermined Anticipated d/c date is: undetermined Patient currently is not medically ready to d/c due to: hypoxic, approaching ESRD   Subjective and Interval History:  Pt  continued to have a wet cough.  Complained of generalized pain.  Family reported leg swelling improved.  Pt ate better since presentation.   Objective: Vitals:   01/14/21 1449 01/14/21 1817 01/14/21 2205 01/14/21 2336  BP: (!) 129/48 (!) 146/54  (!) 137/50  Pulse: 61 64  64  Resp: 17 19  16   Temp: 97.9 F (36.6 C) 97.8 F (36.6 C)  98.8 F (37.1 C)  TempSrc: Oral Oral    SpO2: 93% 92% 93% 90%  Weight:      Height:        Intake/Output Summary (Last 24 hours) at 01/15/2021 0000 Last data filed at 01/14/2021 0600 Gross per 24 hour  Intake --  Output 250 ml  Net -250 ml   Filed Weights   01/13/21 0618 01/14/21 0126  Weight: 70 kg 68.1 kg    Examination:   Constitutional: NAD, alert, oriented to person and place HEENT: conjunctivae and lids normal, EOMI CV: No cyanosis.   RESP: normal respiratory effort, gurgling, on 3L Extremities: No effusions, edema in BLE SKIN: warm, dry   Data Reviewed: I have personally reviewed following labs and imaging studies  CBC: Recent Labs  Lab 01/13/21 0622 01/14/21 0619  WBC 7.6 7.1  NEUTROABS 5.3  --   HGB 7.4* 7.0*  HCT 23.0* 21.0*  MCV 99.1 97.7  PLT 229 517   Basic Metabolic Panel: Recent Labs  Lab 01/13/21 0622 01/14/21 0619  NA 127* 132*  K 5.1 4.8  CL 93* 98  CO2 21* 21*  GLUCOSE 124* 77  BUN 83* 87*  CREATININE 10.01* 10.70*  CALCIUM 7.8* 7.3*  PHOS  --  9.0*   GFR: Estimated Creatinine Clearance: 3.3 mL/min (A) (by C-G formula based on SCr of 10.7 mg/dL (H)). Liver Function Tests: Recent Labs  Lab 01/13/21 0622 01/14/21 0619  AST 11* 12*  ALT 9 9  ALKPHOS 65 67  BILITOT 1.2 1.1  PROT 6.9 6.3*  ALBUMIN 3.1* 2.8*   No results for input(s): LIPASE, AMYLASE in the last 168 hours. No results for input(s): AMMONIA in the last 168 hours. Coagulation Profile: No results for input(s): INR, PROTIME in the last 168 hours. Cardiac Enzymes: No results for input(s): CKTOTAL, CKMB, CKMBINDEX, TROPONINI in  the last 168 hours. BNP (last 3 results) No results for input(s): PROBNP in the last 8760 hours. HbA1C: No results for input(s): HGBA1C in the last 72 hours. CBG: No results for input(s): GLUCAP in the last 168 hours. Lipid Profile: No results for input(s): CHOL, HDL, LDLCALC, TRIG, CHOLHDL, LDLDIRECT in the last 72 hours. Thyroid Function Tests: No results for input(s): TSH, T4TOTAL, FREET4, T3FREE, THYROIDAB in the last 72 hours. Anemia Panel: Recent Labs    01/14/21 0619  FERRITIN 413*  TIBC 188*  IRON 33   Sepsis Labs: Recent Labs  Lab 01/13/21 0622 01/13/21 1558  LATICACIDVEN 0.7 0.7    Recent Results (from the past 240 hour(s))  Resp Panel by RT-PCR (Flu A&B, Covid) Nasopharyngeal Swab     Status: None   Collection Time: 01/13/21  6:24 AM   Specimen: Nasopharyngeal Swab; Nasopharyngeal(NP) swabs in vial transport medium  Result Value Ref Range Status   SARS Coronavirus 2 by RT PCR NEGATIVE NEGATIVE Final    Comment: (NOTE) SARS-CoV-2 target nucleic acids are NOT DETECTED.  The SARS-CoV-2 RNA is generally detectable in upper respiratory specimens during the acute phase of infection. The lowest concentration of SARS-CoV-2 viral copies this assay can detect is 138 copies/mL. A negative result does not preclude SARS-Cov-2 infection and should not be used as the sole basis for treatment or other patient management decisions. A negative result may occur with  improper specimen collection/handling, submission of specimen other than nasopharyngeal swab, presence of viral mutation(s) within the areas targeted by this assay, and inadequate number of viral copies(<138 copies/mL). A negative result must be combined with clinical observations, patient history, and epidemiological information. The expected result is Negative.  Fact Sheet for Patients:  EntrepreneurPulse.com.au  Fact Sheet for Healthcare Providers:   IncredibleEmployment.be  This test is no t yet approved or cleared by the [place] FDA and  has been authorized for detection and/or diagnosis of SARS-CoV-2 by FDA under an Emergency Use Authorization (EUA). This EUA will remain  in effect (meaning this test can be used) for the duration of the COVID-19 declaration under Section 564(b)(1) of the Act, 21 U.S.C.section 360bbb-3(b)(1), unless the authorization is terminated  or revoked sooner.       Influenza A by PCR NEGATIVE NEGATIVE Final   Influenza B by PCR NEGATIVE NEGATIVE Final    Comment: (NOTE) The Xpert Xpress SARS-CoV-2/FLU/RSV plus assay is intended as an aid in the diagnosis of influenza from Nasopharyngeal swab specimens and should not be used as a sole basis for treatment. Nasal washings and aspirates are unacceptable for Xpert Xpress SARS-CoV-2/FLU/RSV testing.  Fact Sheet for Patients: EntrepreneurPulse.com.au  Fact Sheet for Healthcare Providers: IncredibleEmployment.be  This test is not yet approved or cleared by the [place]  States FDA and has been authorized for detection and/or diagnosis of SARS-CoV-2 by FDA under an Emergency Use Authorization (EUA). This EUA will remain in effect (meaning this test can be used) for the duration of the COVID-19 declaration under Section 564(b)(1) of the Act, 21 U.S.C. section 360bbb-3(b)(1), unless the authorization is terminated or revoked.  Performed at [facility], [address], [place],  [address]       Radiology Studies: DG Chest 1 View  Result Date: 01/13/2021 CLINICAL DATA:  Shortness of breath, cough EXAM: CHEST  1 VIEW COMPARISON:  10/04/2020 FINDINGS: Heart is mildly enlarged. Aortic atherosclerosis. Mild vascular congestion. No confluent opacities, effusions or edema. No acute bony abnormality. IMPRESSION: Mild cardiomegaly, vascular congestion. Electronically Signed   By: Rolm Baptise M.D.   On: 01/13/2021 06:38     Scheduled Meds:  amLODipine  10 mg Oral Daily   aspirin EC  81 mg Oral Daily   calcium carbonate  1 tablet Oral BID WC   carvedilol  25 mg Oral BID WC   donepezil  10 mg Oral q morning   fluticasone furoate-vilanterol  1 puff Inhalation Daily   furosemide  80 mg Intravenous BID   heparin  5,000 Units Subcutaneous Q8H   ipratropium-albuterol  3 mL Nebulization Q8H   levothyroxine  25 mcg Oral QAC breakfast   lidocaine  2 patch Transdermal Q24H   pantoprazole  80 mg Oral Q1200   pravastatin  40 mg Oral QPM   QUEtiapine  12.5 mg Oral BID   Continuous Infusions:   LOS: 2 days     Enzo Bi, MD [facility] If 7PM-7AM, please contact night-coverage 01/15/2021, 12:00 AM

## 2021-01-14 NOTE — Consult Note (Addendum)
Consultation Note Date: 01/14/2021   Patient Name: Kristi Brown  DOB: 1929-10-03  MRN: [id number]  Age / Sex: 87 y.o., female  PCP: Danae Orleans, MD Referring Physician: Enzo Bi, MD  Reason for Consultation: Establishing goals of care  HPI/Patient Profile:  Kristi Brown is a 87 y.o. female with a past medical history of, GERD, hypertension, hypothyroidism, chronic kidney disease stage V, HFpEF, dementia, history of CVA/TIA.   This patient presents from home via EMS for shortness of breath and chest pain.  Complaining of a wet cough.  When EMS arrived she was satting in the 79s.  Now on 2 L and saturating well.  Has sputum production as well.  No fevers or chills.  She can tell me her name and where she is.  She cannot tell me the date or who the president is.  No family present at bedside.  Unable to confirm if this is her baseline mental status.  The ED provider did speak with the daughter and the patient has been refusing dialysis.  Denies any chest pain upon my evaluation.  Clinical Assessment and Goals of Care: Patient is resting on bed with son and DIL at bedside. Patient has eyes closed and opens them briefly. She says her throat is dry. Son states the family is aware of renal failure and that she has decided against dialysis. He states the daughters have been the decisions makers for her care, and he is okay with whatever they decide. He states there has been conversation about taking her home at discharge, and patient opens her eyes and nods at this.  Called to speak with Kristi Brown.    We discussed her diagnoses, prognosis, GOC, EOL wishes disposition and options.  Created space and opportunity for patient  to explore thoughts and feelings regarding current medical information.   A detailed discussion was had today regarding advanced directives.  Concepts specific to code status, artifical feeding  and hydration, IV antibiotics and rehospitalization were discussed.  The difference between an aggressive medical intervention path and a comfort care path was discussed.  Values and goals of care important to patient and family were attempted to be elicited.  Discussed limitations of medical interventions to prolong quality of life in some situations and discussed the concept of human mortality.  Kristi Brown understands her mother's status and prognosis. She would like for her to go home with hospice. Discussed that her mental status would decline due to renal failure, and spending the time she has left as she would want to is very important. She states she has spoken with nephrology and a plan is currently in place to diurese her for a few more days before going home. She is amenable to a natural death without CPR. Patient does have other children who are part of the decision making.   SUMMARY OF RECOMMENDATIONS   Continue current care until family is ready to take her home, after conversation with nephrology. When ready, will need home with hospice. Spoke with Dr. Juleen China to  update.  -- No legal HPOA.  -- Kristi Brown is amenable to taking patient home, and allowing a natural death without CPR.  -- Son at bedside wants daughters to make decisions.  -- Patient has 2 other children, a daughter and son.      Prognosis:  < 2 weeks       Primary Diagnoses: Present on Admission:  Acute kidney injury superimposed on chronic kidney disease ([place])   I have reviewed the medical record, interviewed the patient and family, and examined the patient. The following aspects are pertinent.  Past Medical History:  Diagnosis Date   Diabetes mellitus without complication (HCC)    GERD (gastroesophageal reflux disease)    Hypertension    Thyroid disease    Social History   Socioeconomic History   Marital status: Widowed    Spouse name: Not on file   Number of children: Not on file   Years of education:  Not on file   Highest education level: Not on file  Occupational History   Not on file  Tobacco Use   Smoking status: Never   Smokeless tobacco: Never  Vaping Use   Vaping Use: Never used  Substance and Sexual Activity   Alcohol use: No   Drug use: Never   Sexual activity: Not on file  Other Topics Concern   Not on file  Social History Narrative   Not on file   Social Determinants of Health   Financial Resource Strain: Not on file  Food Insecurity: Not on file  Transportation Needs: Not on file  Physical Activity: Not on file  Stress: Not on file  Social Connections: Not on file   Family History  Problem Relation Age of Onset   Cancer Father    Scheduled Meds:  amLODipine  10 mg Oral Daily   aspirin EC  81 mg Oral Daily   calcium carbonate  1 tablet Oral BID WC   carvedilol  25 mg Oral BID WC   donepezil  10 mg Oral q morning   fluticasone furoate-vilanterol  1 puff Inhalation Daily   furosemide  80 mg Intravenous BID   guaiFENesin  600 mg Oral BID   heparin  5,000 Units Subcutaneous Q8H   ipratropium-albuterol  3 mL Nebulization Q8H   levothyroxine  25 mcg Oral QAC breakfast   pantoprazole  80 mg Oral Q1200   pravastatin  40 mg Oral QPM   QUEtiapine  12.5 mg Oral BID   Continuous Infusions: PRN Meds:.acetaminophen, albuterol, loratadine Medications Prior to Admission:  Prior to Admission medications   Medication Sig Start Date End Date Taking? Authorizing Provider  amLODipine (NORVASC) 10 MG tablet Take 10 mg by mouth daily. 11/12/18  Yes [provider]  aspirin 81 MG EC tablet Take 81 mg by mouth daily.   Yes [provider]  calcium carbonate (OS-CAL - DOSED IN MG OF ELEMENTAL CALCIUM) 1250 (500 Ca) MG tablet Take 1 tablet (500 mg of elemental calcium total) by mouth 2 (two) times daily with a meal. 10/05/20  Yes Jennye Boroughs, MD  carvedilol (COREG) 25 MG tablet Take 25 mg by mouth 2 (two) times daily with a meal. 07/08/18 01/13/21 Yes  [provider]  clopidogrel (PLAVIX) 75 MG tablet Take 75 mg by mouth daily. 04/25/20  Yes [provider]  donepezil (ARICEPT) 10 MG tablet Take 10 mg by mouth every morning. 01/10/21  Yes [provider]  esomeprazole (NEXIUM) 40 MG capsule Take 40 mg by mouth  daily at 12 noon. 03/21/13 01/13/21 Yes [provider]  fluticasone-salmeterol (ADVAIR HFA) 115-21 MCG/ACT inhaler 1 puff twice daily 07/16/18  Yes [provider]  furosemide (LASIX) 40 MG tablet Take 40 mg by mouth daily. 01/10/21  Yes [provider]  hydrochlorothiazide (MICROZIDE) 12.5 MG capsule Take 12.5 mg by mouth daily.   Yes [provider]  levothyroxine (SYNTHROID) 25 MCG tablet Take 25 mcg by mouth daily before breakfast. 05/05/19  Yes [provider]  losartan (COZAAR) 25 MG tablet Take 12.5 mg by mouth daily. 01/10/21  Yes [provider]  pravastatin (PRAVACHOL) 40 MG tablet Take 40 mg by mouth daily. 07/08/18 01/13/21 Yes [provider]  QUEtiapine (SEROQUEL) 25 MG tablet Take 12.5 mg by mouth 2 (two) times daily. 05/05/19  Yes [provider]  albuterol (VENTOLIN HFA) 108 (90 Base) MCG/ACT inhaler Inhale 1-2 puffs into the lungs every 6 (six) hours as needed for wheezing or shortness of breath. 02/26/18 07/16/19  [provider]  Alcohol Swabs (B-D SINGLE USE SWABS REGULAR) PADS To be used for checking glucose twice daily 07/22/18   [provider]  Assure Comfort Lancets 28G [place]  06/08/14   [provider]  fluticasone (FLONASE) 50 MCG/ACT nasal spray Place into both nostrils daily.    [provider]  glucose blood (PRECISION QID TEST) test strip To be used to check glucose twice daily. E11.9 02/26/18   [provider]  loratadine (CLARITIN) 10 MG tablet Take 10 mg by mouth daily as needed for allergies. 05/06/19 05/05/20  [provider]  nitroGLYCERIN (NITROSTAT) 0.4 MG SL tablet  Place under the tongue. 12/02/19 12/01/20  [provider]   Allergies  Allergen Reactions   Memantine Other (See Comments)    Excessive sedation Excessive sedation   Review of Systems  HENT:         Throat dryness.    Physical Exam Constitutional:      Comments: Answers some questions.   Pulmonary:     Effort: Pulmonary effort is normal.  Neurological:     Mental Status: She is alert.    Vital Signs: BP (!) 126/57 (BP Location: Right Arm)    Pulse (!) 58    Temp 97.6 F (36.4 C) (Oral)    Resp 18    Ht 5\' 6"  (1.676 m)    Wt 68.1 kg    SpO2 93%    BMI 24.23 kg/m  Pain Scale: PAINAD   Pain Score: 2    SpO2: SpO2: 93 % O2 Device:SpO2: 93 % O2 Flow Rate: .O2 Flow Rate (L/min): 3 L/min  IO: Intake/output summary:  Intake/Output Summary (Last 24 hours) at 01/14/2021 1224 Last data filed at 01/14/2021 0600 Gross per 24 hour  Intake --  Output 250 ml  Net -250 ml    LBM: Last BM Date:  (PTA) Baseline Weight: Weight: 70 kg Most recent weight: Weight: 68.1 kg     Palliative Assessment/Data:     Time In: 12:00 Time Out: 12:40 Time Total: 40 min Greater than 50%  of this time was spent counseling and coordinating care related to the above assessment and plan.  Signed by: Asencion Gowda, NP   Please contact Palliative Medicine Team phone at [phone] for questions and concerns.  For individual provider: See Shea Evans

## 2021-01-15 DIAGNOSIS — Z7189 Other specified counseling: Secondary | ICD-10-CM | POA: Diagnosis not present

## 2021-01-15 DIAGNOSIS — N179 Acute kidney failure, unspecified: Secondary | ICD-10-CM | POA: Diagnosis not present

## 2021-01-15 DIAGNOSIS — N189 Chronic kidney disease, unspecified: Secondary | ICD-10-CM | POA: Diagnosis not present

## 2021-01-15 LAB — CBC
HCT: 21.4 % — ABNORMAL LOW (ref 36.0–46.0)
Hemoglobin: 6.9 g/dL — ABNORMAL LOW (ref 12.0–15.0)
MCH: 30.9 pg (ref 26.0–34.0)
MCHC: 32.2 g/dL (ref 30.0–36.0)
MCV: 96 fL (ref 80.0–100.0)
Platelets: 226 10*3/uL (ref 150–400)
RBC: 2.23 MIL/uL — ABNORMAL LOW (ref 3.87–5.11)
RDW: 12.2 % (ref 11.5–15.5)
WBC: 7.1 10*3/uL (ref 4.0–10.5)
nRBC: 0 % (ref 0.0–0.2)

## 2021-01-15 LAB — BASIC METABOLIC PANEL
Anion gap: 14 (ref 5–15)
BUN: 90 mg/dL — ABNORMAL HIGH (ref 8–23)
CO2: 20 mmol/L — ABNORMAL LOW (ref 22–32)
Calcium: 7 mg/dL — ABNORMAL LOW (ref 8.9–10.3)
Chloride: 95 mmol/L — ABNORMAL LOW (ref 98–111)
Creatinine, Ser: 10.51 mg/dL — ABNORMAL HIGH (ref 0.44–1.00)
GFR, Estimated: 3 mL/min — ABNORMAL LOW (ref >60)
Glucose, Bld: 104 mg/dL — ABNORMAL HIGH (ref 70–99)
Potassium: 4.6 mmol/L (ref 3.5–5.1)
Sodium: 129 mmol/L — ABNORMAL LOW (ref 135–145)

## 2021-01-15 LAB — PARATHYROID HORMONE, INTACT (NO CA): PTH: 123 pg/mL — ABNORMAL HIGH (ref 15–65)

## 2021-01-15 LAB — PROCALCITONIN: Procalcitonin: 0.3 ng/mL

## 2021-01-15 LAB — MAGNESIUM: Magnesium: 2.3 mg/dL (ref 1.7–2.4)

## 2021-01-15 NOTE — Progress Notes (Addendum)
Daily Progress Note   Patient Name: Kristi Brown       Date: 01/15/2021 DOB: [birth year]  Age: 87 y.o. MRN#: [id number] Attending Physician: Enzo Bi, MD Primary Care Physician: Danae Orleans, MD Admit Date: 01/13/2021  Reason for Consultation/Follow-up: Establishing goals of care  Subjective: Patient is resting in bed. She mumbles upon speaking to her, and does not open eyes or speak.  Female standing in hall outside of room on phone upon my entry, during my visit, and upon my exit. RN at bedside to work with patient during my visit. RN confirms this is a family member for her.  Returned shortly after leaving to speak with family member if she was off phone, but she was not in hall or room.   Daughter told me yesterday they are working with nephrology on optimizing prior to discharge.     ADDENDUM: Later in shift nurse advised that the person in the hall may not have been the patient's daughter. Still currently working with nephrology on a discharge time line. Will need hospice to help with symptom management at home.     Length of Stay: 2  Current Medications: Scheduled Meds:   amLODipine  10 mg Oral Daily   aspirin EC  81 mg Oral Daily   calcium carbonate  1 tablet Oral BID WC   carvedilol  25 mg Oral BID WC   donepezil  10 mg Oral q morning   fluticasone furoate-vilanterol  1 puff Inhalation Daily   furosemide  80 mg Intravenous BID   heparin  5,000 Units Subcutaneous Q8H   ipratropium-albuterol  3 mL Nebulization Q8H   levothyroxine  25 mcg Oral QAC breakfast   lidocaine  2 patch Transdermal Q24H   pantoprazole  80 mg Oral Q1200   pravastatin  40 mg Oral QPM   QUEtiapine  12.5 mg Oral BID    Continuous Infusions:   PRN Meds: acetaminophen (TYLENOL) oral liquid 160  mg/5 mL, albuterol, diclofenac Sodium, guaiFENesin, loratadine  Physical Exam Constitutional:      Comments: Eyes closed. Mumbles upon speaking to her.   Pulmonary:     Effort: Pulmonary effort is normal.            Vital Signs: BP (!) 125/43 (BP Location: Right Arm)    Pulse 65  Temp 98.1 F (36.7 C)    Resp 14    Ht 5\' 6"  (1.676 m)    Wt 70.1 kg    SpO2 93%    BMI 24.94 kg/m  SpO2: SpO2: 93 % O2 Device: O2 Device: Nasal Cannula O2 Flow Rate: O2 Flow Rate (L/min): 3.5 L/min  Intake/output summary: No intake or output data in the 24 hours ending 01/15/21 1105 LBM: Last BM Date:  (PTA) Baseline Weight: Weight: 70 kg Most recent weight: Weight: 70.1 kg          Patient Active Problem List   Diagnosis Date Noted   Type 2 diabetes mellitus with hypoglycemia without coma ([place]) 10/03/2020   Acute lower UTI 10/03/2020   Acute kidney injury superimposed on chronic kidney disease ([place]) 10/03/2020   Bilateral lower extremity edema 10/03/2020   AMS (altered mental status) 10/02/2020   Dementia ([place]) 03/20/2020   TIA (transient ischemic attack) 09/19/2019   History of ITP 04/25/2019   Major neurocognitive disorder due to probable Alzheimer's disease, with behavioral disturbance ([place]) 10/12/2018   Pain due to onychomycosis of toenails of both feet 08/05/2018   Diabetes mellitus without complication ([place]) 93/81/0175   Asthma 07/16/2018   Carcinoid (except of appendix) 07/16/2018   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   Dyslipidemia 11/20/2017   Anemia of renal disease 07/03/2017   Iron deficiency anemia 07/15/2016   Vitamin D deficiency 07/15/2016   'light-for-dates' infant with signs of fetal malnutrition 07/14/2016   Chronic kidney disease, unspecified 02/28/2016   GERD (gastroesophageal reflux disease) 02/28/2016   Hypothyroid 02/28/2016   Syncope 02/28/2016   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     Code Status:    Code Status Orders  (From admission, onward)           Start     Ordered   01/13/21 0810  Full code  Continuous        01/13/21 0814           Code Status History     Date Active Date Inactive Code Status Order ID Comments User Context   10/03/2020 0008 10/05/2020 2120 Full Code [id number]  Orene Desanctis, DO ED       Prognosis:  < 2 weeks    Care plan was discussed with RN  Thank you for allowing the Palliative Medicine Team to assist in the care of this patient.       Total Time 15 min Prolonged Time Billed  no       Greater than 50%  of this time was spent counseling and coordinating care related to the above assessment and plan.  Asencion Gowda, NP  Please contact Palliative Medicine Team phone at [phone] for questions and concerns.

## 2021-01-15 NOTE — Progress Notes (Signed)
[facility]  ROUNDING NOTE   Subjective:   Patient seen resting quietly Two daughters at bedside Patient states she feels tired, no improvement from yesterday Cough has improved, but remains  Na 129 (132) (127)  Np urine output recorded  Objective:  Vital signs in last 24 hours:  Temp:  [97.8 F (36.6 C)-98.8 F (37.1 C)] 97.9 F (36.6 C) (12/27 1218) Pulse Rate:  [61-65] 62 (12/27 1218) Resp:  [14-19] 19 (12/27 1218) BP: (125-149)/(43-54) 149/54 (12/27 1218) SpO2:  [90 %-93 %] 91 % (12/27 1218) Weight:  [70.1 kg] 70.1 kg (12/27 0500)  Weight change: 2 kg Filed Weights   01/13/21 0618 01/14/21 0126 01/15/21 0500  Weight: 70 kg 68.1 kg 70.1 kg    Intake/Output: I/O last 3 completed shifts: In: -  Out: 250 [Urine:250]   Intake/Output this shift:  No intake/output data recorded.  Physical Exam: General: NAD, laying in bed  Head: Normocephalic, atraumatic. Moist oral mucosal membranes  Eyes: Anicteric, PERRL  Lungs:  Bilateral crackles, 3L [place]  Heart: Regular rate and rhythm  Abdomen:  Soft, nontender,   Extremities:  no peripheral edema.  Neurologic: Nonfocal, moving all four extremities  Skin: No lesions  Access: none    Basic Metabolic Panel: Recent Labs  Lab 01/13/21 0622 01/14/21 0619 01/15/21 0540  NA 127* 132* 129*  K 5.1 4.8 4.6  CL 93* 98 95*  CO2 21* 21* 20*  GLUCOSE 124* 77 104*  BUN 83* 87* 90*  CREATININE 10.01* 10.70* 10.51*  CALCIUM 7.8* 7.3* 7.0*  MG  --   --  2.3  PHOS  --  9.0*  --      Liver Function Tests: Recent Labs  Lab 01/13/21 0622 01/14/21 0619  AST 11* 12*  ALT 9 9  ALKPHOS 65 67  BILITOT 1.2 1.1  PROT 6.9 6.3*  ALBUMIN 3.1* 2.8*    No results for input(s): LIPASE, AMYLASE in the last 168 hours. No results for input(s): AMMONIA in the last 168 hours.  CBC: Recent Labs  Lab 01/13/21 0622 01/14/21 0619 01/15/21 0540  WBC 7.6 7.1 7.1  NEUTROABS 5.3  --   --   HGB 7.4* 7.0* 6.9*  HCT 23.0*  21.0* 21.4*  MCV 99.1 97.7 96.0  PLT 229 235 226     Cardiac Enzymes: No results for input(s): CKTOTAL, CKMB, CKMBINDEX, TROPONINI in the last 168 hours.  BNP: Invalid input(s): POCBNP  CBG: No results for input(s): GLUCAP in the last 168 hours.  Microbiology: Results for orders placed or performed during the hospital encounter of 01/13/21  Resp Panel by RT-PCR (Flu A&B, Covid) Nasopharyngeal Swab     Status: None   Collection Time: 01/13/21  6:24 AM   Specimen: Nasopharyngeal Swab; Nasopharyngeal(NP) swabs in vial transport medium  Result Value Ref Range Status   SARS Coronavirus 2 by RT PCR NEGATIVE NEGATIVE Final    Comment: (NOTE) SARS-CoV-2 target nucleic acids are NOT DETECTED.  The SARS-CoV-2 RNA is generally detectable in upper respiratory specimens during the acute phase of infection. The lowest concentration of SARS-CoV-2 viral copies this assay can detect is 138 copies/mL. A negative result does not preclude SARS-Cov-2 infection and should not be used as the sole basis for treatment or other patient management decisions. A negative result may occur with  improper specimen collection/handling, submission of specimen other than nasopharyngeal swab, presence of viral mutation(s) within the areas targeted by this assay, and inadequate number of viral copies(<138 copies/mL). A negative  result must be combined with clinical observations, patient history, and epidemiological information. The expected result is Negative.  Fact Sheet for Patients:  EntrepreneurPulse.com.au  Fact Sheet for Healthcare Providers:  IncredibleEmployment.be  This test is no t yet approved or cleared by the [place] FDA and  has been authorized for detection and/or diagnosis of SARS-CoV-2 by FDA under an Emergency Use Authorization (EUA). This EUA will remain  in effect (meaning this test can be used) for the duration of the COVID-19 declaration under  Section 564(b)(1) of the Act, 21 U.S.C.section 360bbb-3(b)(1), unless the authorization is terminated  or revoked sooner.       Influenza A by PCR NEGATIVE NEGATIVE Final   Influenza B by PCR NEGATIVE NEGATIVE Final    Comment: (NOTE) The Xpert Xpress SARS-CoV-2/FLU/RSV plus assay is intended as an aid in the diagnosis of influenza from Nasopharyngeal swab specimens and should not be used as a sole basis for treatment. Nasal washings and aspirates are unacceptable for Xpert Xpress SARS-CoV-2/FLU/RSV testing.  Fact Sheet for Patients: EntrepreneurPulse.com.au  Fact Sheet for Healthcare Providers: IncredibleEmployment.be  This test is not yet approved or cleared by the [place] FDA and has been authorized for detection and/or diagnosis of SARS-CoV-2 by FDA under an Emergency Use Authorization (EUA). This EUA will remain in effect (meaning this test can be used) for the duration of the COVID-19 declaration under Section 564(b)(1) of the Act, 21 U.S.C. section 360bbb-3(b)(1), unless the authorization is terminated or revoked.  Performed at [facility], [address] [place], [place] [address]     Coagulation Studies: No results for input(s): LABPROT, INR in the last 72 hours.  Urinalysis: Recent Labs    01/13/21 0806  COLORURINE YELLOW  LABSPEC 1.020  PHURINE 5.0  GLUCOSEU NEGATIVE  HGBUR NEGATIVE  BILIRUBINUR NEGATIVE  KETONESUR NEGATIVE  PROTEINUR >300*  NITRITE 0.2*  LEUKOCYTESUR TRACE*       Imaging: No results found.   Medications:     amLODipine  10 mg Oral Daily   aspirin EC  81 mg Oral Daily   calcium carbonate  1 tablet Oral BID WC   carvedilol  25 mg Oral BID WC   donepezil  10 mg Oral q morning   fluticasone furoate-vilanterol  1 puff Inhalation Daily   furosemide  80 mg Intravenous BID   heparin  5,000 Units Subcutaneous Q8H   ipratropium-albuterol  3 mL Nebulization Q8H   levothyroxine   25 mcg Oral QAC breakfast   lidocaine  2 patch Transdermal Q24H   pantoprazole  80 mg Oral Q1200   pravastatin  40 mg Oral QPM   QUEtiapine  12.5 mg Oral BID   acetaminophen (TYLENOL) oral liquid 160 mg/5 mL, albuterol, diclofenac Sodium, guaiFENesin, loratadine  Assessment/ Plan:  Kristi Brown is a 87 y.o. black female with diabetes mellitus type II, hypertension, GERD, hypothyroidism, macular degeneration, demenita, CVA/TIA, ITP, hyperlipidemia, and history of carcinoid tumor who was admitted to [facility] on 01/13/2021 for Hypervolemia, unspecified hypervolemia type [E87.70] Acute kidney injury superimposed on chronic kidney disease ([place]) [N17.9, N18.9] Acute renal failure superimposed on chronic kidney disease, unspecified CKD stage, unspecified acute renal failure type ([place]) [N17.9, N18.9]   Acute kidney injury on chronic kidney disease stage V versus progression to end stage renal disease. Creatinine baseline of 6.13, GFR of 6 on 10/05/20. Followed by [facility], Dr. Ralph Dowdy. Most likely progression of disease. - holding lisinopril - Creatinine remains elevated with no urine output recorded. Furosemide  given with little desired result, considered a failed trial. - Spoke with family and agreed to continue current treatments - Palliative care consulted and discussed discharge planning with family. According to Palliative note, family agreeable to discharge with hospice when time comes.    Hypertension and acute exacerbation of diastolic congestive heart failure. Echocardiogram on 10/03/20. Home regimen of amlodipine, carvedilol, lisinopril and hydrochlorothiazide.  - do not recommend resuming hydrochlorothiazide.  - holding lisinopril - Continue amlodipine and carvedilol.  - Continue IV furosemide 80mg  q12 for now until [place] discussed   Anemia of chronic kidney disease: hemoglobin 6.9. No history of ESA use. Iron level are at goal. Low threshold for PRBC transfusion.     LOS:  2   12/27/20221:39 PM

## 2021-01-15 NOTE — Progress Notes (Signed)
°   01/15/21 1200  Clinical Encounter Type  Visited With Patient and family together  Visit Type Spiritual support  Referral From Family  Consult/Referral To [place] visited with Mrs. Kristi Brown in unit 2 room 258A. [name] daughter was by her bedside preparing to feed her lunch. I provided a ministry of presence words of encouragement and prayer. [name] was enroute to visit her also, I met him as I was exited the room. Marland Kitchen

## 2021-01-15 NOTE — Progress Notes (Addendum)
PROGRESS NOTE    Kristi Brown  TZG:[id number] DOB: 1929-07-10 DOA: 01/13/2021 PCP: Danae Orleans, MD  [id number]   Assessment & Plan:   Principal Problem:   Acute kidney injury superimposed on chronic kidney disease ([place])   Kristi Brown is a 87 y.o. female with a past medical history of, GERD, hypertension, hypothyroidism, chronic kidney disease stage V, HFpEF, dementia, history of CVA/TIA.   This patient presents from home via EMS for shortness of breath and chest pain.  Complaining of a wet cough.  When EMS arrived she was satting in the 80s and placed on 2L O2.   Acute hypoxic respiratory failure  --from fluid overload 2/2 kidney failure. --Currently on 3L and saturating well.  Chest x-ray showed vascular congestion and mild cardiomegaly. --started IV Lasix 80 mg BID --Started Rocephin and Zithromax for empiric CAP coverage on admission, since d/c'ed since no strong evidence of PNA. Plan: --cont IV lasix 80 BID --Robitussin q6h scheduled, per daughter's request  AKI on CKD V:  --Cr 10 on presentation, increased from 6's in Sept 2022. --pt confirmed she doesn't want dialysis --nephrology consulted Plan: --hold home HCTZ, Cozaar --cont IV diuresis --palliative consult --consider placing Foley for more accurate measurement of urine output.   HFpEF: Acute exacerbation.  Previous ECHO on 9/22 showed LVEF 65-70% with G3DD.   --started on IV lasix on admission Plan: --cont IV lasix 80 BID   Hyponatremia:  Likely hypervolemic.   --diurese   Dementia:  Continue home Aricept and Seroquel   Hypertension:  Continue home Norvasc, Coreg --hold home HCTZ, Cozaar --cont IV lasix   Hyperlipidemia:  Continue home Pravachol   GERD:  --cont home PPI   Hypothyroidism:  Continue home Synthroid   History of CVA/TIA:  Continue home aspirin, Plavix and Pravachol  Generalized pain --daughter does not want opioids pian meds at this point --tylenol 1000 mg  q6h --Diclofenc gel --Lidocaine patch    DVT prophylaxis: Heparin SQ Code Status: Full code  Family Communication: daughters updated at bedside today  Level of care: Telemetry Cardiac Dispo:   The patient is from: home Anticipated d/c is to: undetermined Anticipated d/c date is: 2-3 days Patient currently is not medically ready to d/c due to: hypoxic, approaching ESRD, family waiting to see how pt responds to IV lasix before deciding on comfort care and hospice   Subjective and Interval History:  Per RN, pt was very somnolent this morning, so morning meds held.  Pt made some urine, however, not able to do accurate measurement of amount due to urine leaking away from [facility].    Daughter thought pt was less short of breath.   Objective: Vitals:   01/15/21 0500 01/15/21 1218 01/15/21 1431 01/15/21 1629  BP:  (!) 149/54  (!) 128/48  Pulse:  62  (!) 57  Resp:  19  18  Temp:  97.9 F (36.6 C)  97.7 F (36.5 C)  TempSrc:      SpO2:  91% 95% 99%  Weight: 70.1 kg     Height:        Intake/Output Summary (Last 24 hours) at 01/15/2021 2040 Last data filed at 01/15/2021 1925 Gross per 24 hour  Intake 0 ml  Output 250 ml  Net -250 ml   Filed Weights   01/13/21 0618 01/14/21 0126 01/15/21 0500  Weight: 70 kg 68.1 kg 70.1 kg    Examination:   Constitutional: NAD, alert, somewhat confused HEENT: conjunctivae and lids normal, EOMI CV: No  cyanosis.   RESP: normal respiratory effort, on 3L Extremities: No effusions, edema in BLE SKIN: warm, dry   Data Reviewed: I have personally reviewed following labs and imaging studies  CBC: Recent Labs  Lab 01/13/21 0622 01/14/21 0619 01/15/21 0540  WBC 7.6 7.1 7.1  NEUTROABS 5.3  --   --   HGB 7.4* 7.0* 6.9*  HCT 23.0* 21.0* 21.4*  MCV 99.1 97.7 96.0  PLT 229 235 660   Basic Metabolic Panel: Recent Labs  Lab 01/13/21 0622 01/14/21 0619 01/15/21 0540  NA 127* 132* 129*  K 5.1 4.8 4.6  CL 93* 98 95*  CO2 21* 21*  20*  GLUCOSE 124* 77 104*  BUN 83* 87* 90*  CREATININE 10.01* 10.70* 10.51*  CALCIUM 7.8* 7.3* 7.0*  MG  --   --  2.3  PHOS  --  9.0*  --    GFR: Estimated Creatinine Clearance: 3.4 mL/min (A) (by C-G formula based on SCr of 10.51 mg/dL (H)). Liver Function Tests: Recent Labs  Lab 01/13/21 0622 01/14/21 0619  AST 11* 12*  ALT 9 9  ALKPHOS 65 67  BILITOT 1.2 1.1  PROT 6.9 6.3*  ALBUMIN 3.1* 2.8*   No results for input(s): LIPASE, AMYLASE in the last 168 hours. No results for input(s): AMMONIA in the last 168 hours. Coagulation Profile: No results for input(s): INR, PROTIME in the last 168 hours. Cardiac Enzymes: No results for input(s): CKTOTAL, CKMB, CKMBINDEX, TROPONINI in the last 168 hours. BNP (last 3 results) No results for input(s): PROBNP in the last 8760 hours. HbA1C: No results for input(s): HGBA1C in the last 72 hours. CBG: No results for input(s): GLUCAP in the last 168 hours. Lipid Profile: No results for input(s): CHOL, HDL, LDLCALC, TRIG, CHOLHDL, LDLDIRECT in the last 72 hours. Thyroid Function Tests: No results for input(s): TSH, T4TOTAL, FREET4, T3FREE, THYROIDAB in the last 72 hours. Anemia Panel: Recent Labs    01/14/21 0619  FERRITIN 413*  TIBC 188*  IRON 33   Sepsis Labs: Recent Labs  Lab 01/13/21 0622 01/13/21 1558 01/15/21 0540  PROCALCITON  --   --  0.30  LATICACIDVEN 0.7 0.7  --     Recent Results (from the past 240 hour(s))  Resp Panel by RT-PCR (Flu A&B, Covid) Nasopharyngeal Swab     Status: None   Collection Time: 01/13/21  6:24 AM   Specimen: Nasopharyngeal Swab; Nasopharyngeal(NP) swabs in vial transport medium  Result Value Ref Range Status   SARS Coronavirus 2 by RT PCR NEGATIVE NEGATIVE Final    Comment: (NOTE) SARS-CoV-2 target nucleic acids are NOT DETECTED.  The SARS-CoV-2 RNA is generally detectable in upper respiratory specimens during the acute phase of infection. The lowest concentration of SARS-CoV-2 viral  copies this assay can detect is 138 copies/mL. A negative result does not preclude SARS-Cov-2 infection and should not be used as the sole basis for treatment or other patient management decisions. A negative result may occur with  improper specimen collection/handling, submission of specimen other than nasopharyngeal swab, presence of viral mutation(s) within the areas targeted by this assay, and inadequate number of viral copies(<138 copies/mL). A negative result must be combined with clinical observations, patient history, and epidemiological information. The expected result is Negative.  Fact Sheet for Patients:  EntrepreneurPulse.com.au  Fact Sheet for Healthcare Providers:  IncredibleEmployment.be  This test is no t yet approved or cleared by the [place] FDA and  has been authorized for detection and/or diagnosis of SARS-CoV-2 by  FDA under an Emergency Use Authorization (EUA). This EUA will remain  in effect (meaning this test can be used) for the duration of the COVID-19 declaration under Section 564(b)(1) of the Act, 21 U.S.C.section 360bbb-3(b)(1), unless the authorization is terminated  or revoked sooner.       Influenza A by PCR NEGATIVE NEGATIVE Final   Influenza B by PCR NEGATIVE NEGATIVE Final    Comment: (NOTE) The Xpert Xpress SARS-CoV-2/FLU/RSV plus assay is intended as an aid in the diagnosis of influenza from Nasopharyngeal swab specimens and should not be used as a sole basis for treatment. Nasal washings and aspirates are unacceptable for Xpert Xpress SARS-CoV-2/FLU/RSV testing.  Fact Sheet for Patients: EntrepreneurPulse.com.au  Fact Sheet for Healthcare Providers: IncredibleEmployment.be  This test is not yet approved or cleared by the [place] FDA and has been authorized for detection and/or diagnosis of SARS-CoV-2 by FDA under an Emergency Use Authorization (EUA). This  EUA will remain in effect (meaning this test can be used) for the duration of the COVID-19 declaration under Section 564(b)(1) of the Act, 21 U.S.C. section 360bbb-3(b)(1), unless the authorization is terminated or revoked.  Performed at [facility], [address] [place], [place] [address]       Radiology Studies: No results found.   Scheduled Meds:  amLODipine  10 mg Oral Daily   aspirin EC  81 mg Oral Daily   calcium carbonate  1 tablet Oral BID WC   carvedilol  25 mg Oral BID WC   donepezil  10 mg Oral q morning   fluticasone furoate-vilanterol  1 puff Inhalation Daily   furosemide  80 mg Intravenous BID   heparin  5,000 Units Subcutaneous Q8H   ipratropium-albuterol  3 mL Nebulization Q8H   levothyroxine  25 mcg Oral QAC breakfast   lidocaine  2 patch Transdermal Q24H   pantoprazole  80 mg Oral Q1200   pravastatin  40 mg Oral QPM   QUEtiapine  12.5 mg Oral BID   Continuous Infusions:   LOS: 2 days     Enzo Bi, MD [facility] If 7PM-7AM, please contact night-coverage 01/15/2021, 8:40 PM

## 2021-01-16 DIAGNOSIS — N179 Acute kidney failure, unspecified: Secondary | ICD-10-CM | POA: Diagnosis not present

## 2021-01-16 DIAGNOSIS — N185 Chronic kidney disease, stage 5: Secondary | ICD-10-CM

## 2021-01-16 DIAGNOSIS — Z515 Encounter for palliative care: Secondary | ICD-10-CM

## 2021-01-16 DIAGNOSIS — Z7189 Other specified counseling: Secondary | ICD-10-CM | POA: Diagnosis not present

## 2021-01-16 DIAGNOSIS — N189 Chronic kidney disease, unspecified: Secondary | ICD-10-CM | POA: Diagnosis not present

## 2021-01-16 LAB — CBC
HCT: 20.5 % — ABNORMAL LOW (ref 36.0–46.0)
Hemoglobin: 6.9 g/dL — ABNORMAL LOW (ref 12.0–15.0)
MCH: 31.8 pg (ref 26.0–34.0)
MCHC: 33.7 g/dL (ref 30.0–36.0)
MCV: 94.5 fL (ref 80.0–100.0)
Platelets: 238 10*3/uL (ref 150–400)
RBC: 2.17 MIL/uL — ABNORMAL LOW (ref 3.87–5.11)
RDW: 12.3 % (ref 11.5–15.5)
WBC: 7.8 10*3/uL (ref 4.0–10.5)
nRBC: 0 % (ref 0.0–0.2)

## 2021-01-16 LAB — BASIC METABOLIC PANEL
Anion gap: 13 (ref 5–15)
BUN: 95 mg/dL — ABNORMAL HIGH (ref 8–23)
CO2: 20 mmol/L — ABNORMAL LOW (ref 22–32)
Calcium: 7.2 mg/dL — ABNORMAL LOW (ref 8.9–10.3)
Chloride: 94 mmol/L — ABNORMAL LOW (ref 98–111)
Creatinine, Ser: 10.64 mg/dL — ABNORMAL HIGH (ref 0.44–1.00)
GFR, Estimated: 3 mL/min — ABNORMAL LOW (ref >60)
Glucose, Bld: 86 mg/dL (ref 70–99)
Potassium: 4.5 mmol/L (ref 3.5–5.1)
Sodium: 127 mmol/L — ABNORMAL LOW (ref 135–145)

## 2021-01-16 LAB — MAGNESIUM: Magnesium: 2.4 mg/dL (ref 1.7–2.4)

## 2021-01-16 MED ORDER — GLYCOPYRROLATE 1 MG PO TABS
1.0000 mg | ORAL_TABLET | ORAL | Status: DC | PRN
  Filled 2021-01-16: qty 1

## 2021-01-16 MED ORDER — OXYCODONE HCL 20 MG/ML PO CONC
5.0000 mg | ORAL | Status: DC | PRN
  Administered 2021-01-18: 02:00:00 5 mg via ORAL
  Filled 2021-01-16 (×13): qty 1

## 2021-01-16 MED ORDER — GUAIFENESIN 100 MG/5ML PO LIQD
5.0000 mL | Freq: Four times a day (QID) | ORAL | Status: DC
Start: 2021-01-16 — End: 2021-01-18
  Administered 2021-01-16 – 2021-01-18 (×8): 5 mL via ORAL
  Filled 2021-01-16 (×4): qty 5
  Filled 2021-01-16: qty 10
  Filled 2021-01-16 (×2): qty 5
  Filled 2021-01-16: qty 10

## 2021-01-16 MED ORDER — ONDANSETRON HCL 4 MG/2ML IJ SOLN
4.0000 mg | Freq: Four times a day (QID) | INTRAMUSCULAR | Status: DC | PRN
  Administered 2021-01-17: 10:00:00 4 mg via INTRAVENOUS
  Filled 2021-01-16: qty 2

## 2021-01-16 MED ORDER — GLYCOPYRROLATE 0.2 MG/ML IJ SOLN
0.2000 mg | INTRAMUSCULAR | Status: DC | PRN
  Filled 2021-01-16: qty 1

## 2021-01-16 MED ORDER — ACETAMINOPHEN 650 MG RE SUPP: 650.0000 mg | Freq: Four times a day (QID) | RECTAL | Status: DC | PRN

## 2021-01-16 MED ORDER — HALOPERIDOL LACTATE 5 MG/ML IJ SOLN
0.5000 mg | INTRAMUSCULAR | Status: DC | PRN
  Administered 2021-01-18 – 2021-01-22 (×4): 0.5 mg via INTRAVENOUS
  Filled 2021-01-16 (×6): qty 1

## 2021-01-16 MED ORDER — GLYCOPYRROLATE 0.2 MG/ML IJ SOLN
0.2000 mg | INTRAMUSCULAR | Status: DC | PRN
  Administered 2021-01-19 – 2021-01-22 (×6): 0.2 mg via INTRAVENOUS
  Filled 2021-01-16 (×8): qty 1

## 2021-01-16 MED ORDER — BIOTENE DRY MOUTH MT LIQD: 15.0000 mL | OROMUCOSAL | Status: DC | PRN

## 2021-01-16 MED ORDER — ACETAMINOPHEN 160 MG/5ML PO SOLN
1000.0000 mg | Freq: Four times a day (QID) | ORAL | Status: DC
  Administered 2021-01-16 – 2021-01-18 (×8): 1000 mg via ORAL
  Filled 2021-01-16 (×12): qty 40.6

## 2021-01-16 MED ORDER — OXYCODONE HCL 20 MG/ML PO CONC: 5.0000 mg | ORAL | Status: DC | PRN

## 2021-01-16 MED ORDER — HALOPERIDOL 0.5 MG PO TABS
0.5000 mg | ORAL_TABLET | ORAL | Status: DC | PRN
  Filled 2021-01-16: qty 1

## 2021-01-16 MED ORDER — HALOPERIDOL LACTATE 2 MG/ML PO CONC
0.5000 mg | ORAL | Status: DC | PRN
  Administered 2021-01-22: 18:00:00 0.5 mg via SUBLINGUAL
  Filled 2021-01-16 (×2): qty 0.3

## 2021-01-16 MED ORDER — POLYVINYL ALCOHOL 1.4 % OP SOLN
1.0000 [drp] | Freq: Four times a day (QID) | OPHTHALMIC | Status: DC | PRN
  Filled 2021-01-16: qty 15

## 2021-01-16 MED ORDER — ONDANSETRON 4 MG PO TBDP
4.0000 mg | ORAL_TABLET | Freq: Four times a day (QID) | ORAL | Status: DC | PRN
  Filled 2021-01-16: qty 1

## 2021-01-16 MED ORDER — ACETAMINOPHEN 325 MG PO TABS: 650.0000 mg | ORAL_TABLET | Freq: Four times a day (QID) | ORAL | Status: DC | PRN

## 2021-01-16 NOTE — Care Management Important Message (Signed)
Important Message  Patient Details  Name: Kristi Brown MRN: [id number] Date of Birth: 07-Oct-1929   Medicare Important Message Given:  N/A - LOS <3 / Initial given by admissions     [name] 01/16/2021, 8:30 AM

## 2021-01-16 NOTE — Progress Notes (Signed)
Pt made comfort care during shift, DNR armband placed on pt. MD discussed that family would like pt to continue to receive medications until further notice.

## 2021-01-16 NOTE — Progress Notes (Addendum)
Daily Progress Note   Patient Name: Kristi Brown       Date: 01/16/2021 DOB: Aug 02, 1929  Age: 87 y.o. MRN#: [id number] Attending Physician: Mariel Aloe, MD Primary Care Physician: Danae Orleans, MD Admit Date: 01/13/2021  Reason for Consultation/Follow-up: Establishing goals of care  Subjective: Patient is resting in bed. Daughter Butch Penny is at bedside. Nephrology is at bedside, and have finished their conversation and leaving. She is tearful, but thankful for the care that has been provided. She states the discussion was regarding going home tomorrow, and at the latest Friday. We discussed limited prognosis. We discussed the anticipated decline in cognition, and expected progressive lethargy given renal failure and increasing BUN and creatinine. We discussed hospice and a comfort path, as well as symptom management; patient currently complains of headache. We discussed a DNR status to allow a peaceful and natural death. Patient answers "yes" that she is ready to go home with her family. She answers "yes" she is ready to go home and be with the [place]. Butch Penny states she will talk with her family.   Recommendations/Plan: Home with hospice when family is ready. When they are ready, please have TOC contact hospice to make arrangements.   Length of Stay: 3  Current Medications: Scheduled Meds:   acetaminophen (TYLENOL) oral liquid 160 mg/5 mL  1,000 mg Oral Q6H   amLODipine  10 mg Oral Daily   aspirin EC  81 mg Oral Daily   calcium carbonate  1 tablet Oral BID WC   carvedilol  25 mg Oral BID WC   donepezil  10 mg Oral q morning   fluticasone furoate-vilanterol  1 puff Inhalation Daily   guaiFENesin  5 mL Oral Q6H   heparin  5,000 Units Subcutaneous Q8H   levothyroxine  25 mcg Oral QAC  breakfast   lidocaine  2 patch Transdermal Q24H   pantoprazole  80 mg Oral Q1200   pravastatin  40 mg Oral QPM   QUEtiapine  12.5 mg Oral BID    Continuous Infusions:   PRN Meds: albuterol, diclofenac Sodium, loratadine  Physical Exam Constitutional:      Comments: Will arouse and answer questions.   Pulmonary:     Effort: Pulmonary effort is normal.            Vital Signs: BP Marland Kitchen)  139/50 (BP Location: Left Arm)    Pulse 61    Temp 98.8 F (37.1 C)    Resp 16    Ht 5\' 6"  (1.676 m)    Wt 70.2 kg    SpO2 95%    BMI 24.98 kg/m  SpO2: SpO2: 95 % O2 Device: O2 Device: Nasal Cannula O2 Flow Rate: O2 Flow Rate (L/min): 2 L/min  Intake/output summary:  Intake/Output Summary (Last 24 hours) at 01/16/2021 1148 Last data filed at 01/16/2021 1040 Gross per 24 hour  Intake 240 ml  Output 370 ml  Net -130 ml   LBM: Last BM Date:  (PTA) Baseline Weight: Weight: 70 kg Most recent weight: Weight: 70.2 kg        Patient Active Problem List   Diagnosis Date Noted   Type 2 diabetes mellitus with hypoglycemia without coma ([place]) 10/03/2020   Acute lower UTI 10/03/2020   Acute kidney injury superimposed on chronic kidney disease ([place]) 10/03/2020   Bilateral lower extremity edema 10/03/2020   AMS (altered mental status) 10/02/2020   Dementia ([place]) 03/20/2020   TIA (transient ischemic attack) 09/19/2019   History of ITP 04/25/2019   Major neurocognitive disorder due to probable Alzheimer's disease, with behavioral disturbance ([place]) 10/12/2018   Pain due to onychomycosis of toenails of both feet 08/05/2018   Diabetes mellitus without complication ([place]) 73/53/2992   Asthma 07/16/2018   Carcinoid (except of appendix) 07/16/2018   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   Dyslipidemia 11/20/2017   Anemia of renal disease 07/03/2017   Iron deficiency anemia 07/15/2016   Vitamin D deficiency 07/15/2016   'light-for-dates' infant with signs of fetal malnutrition 07/14/2016   Chronic kidney  disease, unspecified 02/28/2016   GERD (gastroesophageal reflux disease) 02/28/2016   Hypothyroid 02/28/2016   Syncope 02/28/2016   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 When they are ready, please have TOC contact hospice to make arrangements.    Code Status:    Code Status Orders  (From admission, onward)           Start     Ordered   01/13/21 0810  Full code  Continuous        01/13/21 0814           Code Status History     Date Active Date Inactive Code Status Order ID Comments User Context   10/03/2020 0008 10/05/2020 2120 Full Code [id number]  Orene Desanctis, DO ED       Prognosis:  < 2 weeks    Care plan was discussed with Nephrology  Thank you for allowing the Palliative Medicine Team to assist in the care of this patient.       Total Time 25 min Prolonged Time Billed  no       Greater than 50%  of this time was spent counseling and coordinating care related to the above assessment and plan.  Asencion Gowda, NP  Please contact Palliative Medicine Team phone at [phone] for questions and concerns.

## 2021-01-16 NOTE — Progress Notes (Signed)
[facility]  ROUNDING NOTE   Subjective:   Patient continues to complain of generalized malaise.  Poor oral intake  Decreased urine output recorded, but questionable accuracy   Na 127 (129) (132) (127)  Objective:  Vital signs in last 24 hours:  Temp:  [97.7 F (36.5 C)-98.8 F (37.1 C)] 98.1 F (36.7 C) (12/28 1159) Pulse Rate:  [57-61] 57 (12/28 1159) Resp:  [16-20] 16 (12/28 1159) BP: (128-139)/(48-59) 131/59 (12/28 1159) SpO2:  [94 %-99 %] 95 % (12/28 1159) Weight:  [70.2 kg] 70.2 kg (12/28 0438)  Weight change: 0.1 kg Filed Weights   01/14/21 0126 01/15/21 0500 01/16/21 0438  Weight: 68.1 kg 70.1 kg 70.2 kg    Intake/Output: I/O last 3 completed shifts: In: 0  Out: 370 [Urine:370]   Intake/Output this shift:  Total I/O In: 240 [P.O.:240] Out: -   Physical Exam: General: NAD, laying in bed  Head: Normocephalic, atraumatic. Moist oral mucosal membranes  Eyes: Anicteric, PERRL  Lungs:  Bilateral crackles, 3L [place]  Heart: Regular rate and rhythm  Abdomen:  Soft, nontender  Extremities:  no peripheral edema.  Neurologic: Nonfocal, moving all four extremities  Skin: No lesions  Access: none    Basic Metabolic Panel: Recent Labs  Lab 01/13/21 0622 01/14/21 0619 01/15/21 0540 01/16/21 0503  NA 127* 132* 129* 127*  K 5.1 4.8 4.6 4.5  CL 93* 98 95* 94*  CO2 21* 21* 20* 20*  GLUCOSE 124* 77 104* 86  BUN 83* 87* 90* 95*  CREATININE 10.01* 10.70* 10.51* 10.64*  CALCIUM 7.8* 7.3* 7.0* 7.2*  MG  --   --  2.3 2.4  PHOS  --  9.0*  --   --      Liver Function Tests: Recent Labs  Lab 01/13/21 0622 01/14/21 0619  AST 11* 12*  ALT 9 9  ALKPHOS 65 67  BILITOT 1.2 1.1  PROT 6.9 6.3*  ALBUMIN 3.1* 2.8*    No results for input(s): LIPASE, AMYLASE in the last 168 hours. No results for input(s): AMMONIA in the last 168 hours.  CBC: Recent Labs  Lab 01/13/21 0622 01/14/21 0619 01/15/21 0540 01/16/21 0503  WBC 7.6 7.1 7.1 7.8   NEUTROABS 5.3  --   --   --   HGB 7.4* 7.0* 6.9* 6.9*  HCT 23.0* 21.0* 21.4* 20.5*  MCV 99.1 97.7 96.0 94.5  PLT 229 235 226 238     Cardiac Enzymes: No results for input(s): CKTOTAL, CKMB, CKMBINDEX, TROPONINI in the last 168 hours.  BNP: Invalid input(s): POCBNP  CBG: No results for input(s): GLUCAP in the last 168 hours.  Microbiology: Results for orders placed or performed during the hospital encounter of 01/13/21  Resp Panel by RT-PCR (Flu A&B, Covid) Nasopharyngeal Swab     Status: None   Collection Time: 01/13/21  6:24 AM   Specimen: Nasopharyngeal Swab; Nasopharyngeal(NP) swabs in vial transport medium  Result Value Ref Range Status   SARS Coronavirus 2 by RT PCR NEGATIVE NEGATIVE Final    Comment: (NOTE) SARS-CoV-2 target nucleic acids are NOT DETECTED.  The SARS-CoV-2 RNA is generally detectable in upper respiratory specimens during the acute phase of infection. The lowest concentration of SARS-CoV-2 viral copies this assay can detect is 138 copies/mL. A negative result does not preclude SARS-Cov-2 infection and should not be used as the sole basis for treatment or other patient management decisions. A negative result may occur with  improper specimen collection/handling, submission of specimen other than nasopharyngeal  swab, presence of viral mutation(s) within the areas targeted by this assay, and inadequate number of viral copies(<138 copies/mL). A negative result must be combined with clinical observations, patient history, and epidemiological information. The expected result is Negative.  Fact Sheet for Patients:  EntrepreneurPulse.com.au  Fact Sheet for Healthcare Providers:  IncredibleEmployment.be  This test is no t yet approved or cleared by the [place] FDA and  has been authorized for detection and/or diagnosis of SARS-CoV-2 by FDA under an Emergency Use Authorization (EUA). This EUA will remain  in effect  (meaning this test can be used) for the duration of the COVID-19 declaration under Section 564(b)(1) of the Act, 21 U.S.C.section 360bbb-3(b)(1), unless the authorization is terminated  or revoked sooner.       Influenza A by PCR NEGATIVE NEGATIVE Final   Influenza B by PCR NEGATIVE NEGATIVE Final    Comment: (NOTE) The Xpert Xpress SARS-CoV-2/FLU/RSV plus assay is intended as an aid in the diagnosis of influenza from Nasopharyngeal swab specimens and should not be used as a sole basis for treatment. Nasal washings and aspirates are unacceptable for Xpert Xpress SARS-CoV-2/FLU/RSV testing.  Fact Sheet for Patients: EntrepreneurPulse.com.au  Fact Sheet for Healthcare Providers: IncredibleEmployment.be  This test is not yet approved or cleared by the [place] FDA and has been authorized for detection and/or diagnosis of SARS-CoV-2 by FDA under an Emergency Use Authorization (EUA). This EUA will remain in effect (meaning this test can be used) for the duration of the COVID-19 declaration under Section 564(b)(1) of the Act, 21 U.S.C. section 360bbb-3(b)(1), unless the authorization is terminated or revoked.  Performed at [facility], [address] [place], [place] [address]     Coagulation Studies: No results for input(s): LABPROT, INR in the last 72 hours.  Urinalysis: No results for input(s): COLORURINE, LABSPEC, PHURINE, GLUCOSEU, HGBUR, BILIRUBINUR, KETONESUR, PROTEINUR, UROBILINOGEN, NITRITE, LEUKOCYTESUR in the last 72 hours.  Invalid input(s): APPERANCEUR     Imaging: No results found.   Medications:     acetaminophen (TYLENOL) oral liquid 160 mg/5 mL  1,000 mg Oral Q6H   amLODipine  10 mg Oral Daily   aspirin EC  81 mg Oral Daily   calcium carbonate  1 tablet Oral BID WC   carvedilol  25 mg Oral BID WC   donepezil  10 mg Oral q morning   fluticasone furoate-vilanterol  1 puff Inhalation Daily    guaiFENesin  5 mL Oral Q6H   heparin  5,000 Units Subcutaneous Q8H   levothyroxine  25 mcg Oral QAC breakfast   lidocaine  2 patch Transdermal Q24H   pantoprazole  80 mg Oral Q1200   pravastatin  40 mg Oral QPM   QUEtiapine  12.5 mg Oral BID   albuterol, diclofenac Sodium, loratadine  Assessment/ Plan:  Kristi Brown is a 87 y.o. black female with diabetes mellitus type II, hypertension, GERD, hypothyroidism, macular degeneration, demenita, CVA/TIA, ITP, hyperlipidemia, and history of carcinoid tumor who was admitted to [facility]21 for Hypervolemia, unspecified hypervolemia type [E87.70] Acute kidney injury superimposed on chronic kidney disease ([place]) [N17.9, N18.9] Acute renal failure superimposed on chronic kidney disease, unspecified CKD stage, unspecified acute renal failure type ([place]) [N17.9, N18.9]   Acute kidney injury on chronic kidney disease stage V versus progression to end stage renal disease. Creatinine baseline of 6.13, GFR of 6 on 10/05/20. Followed by [facility], Dr. Ralph Dowdy. Most likely progression of disease. - holding lisinopril - Creatinine remains elevated along with  BUN - IV furosemide not yielding desired results. Will stop Furosemide - Discussed with family, current treatments are not proving effective. Encouraged family to discuss [place] with palliative team. Daughter tearful during conversation.  - Palliative care will follow up with [place] and discharge planning.    Hypertension and acute exacerbation of diastolic congestive heart failure. Echocardiogram on 10/03/20. Home regimen of amlodipine, carvedilol, lisinopril and hydrochlorothiazide.  - do not recommend resuming hydrochlorothiazide.  - holding lisinopril - Continue amlodipine and carvedilol.  - Stopped Furosemide   Anemia of chronic kidney disease: hemoglobin 6.9. No history of ESA use. Iron level are at goal. Low threshold for PRBC transfusion.     LOS: 3   12/28/20222:19 PM

## 2021-01-16 NOTE — Progress Notes (Signed)
PROGRESS NOTE    Kristi Brown  TDV:[id number] DOB: 12/15/1929 DOA: 01/13/2021 PCP: Danae Orleans, MD   Brief Narrative: Kristi Brown is a 87 y.o. female with a history of GERD, hypertension, hypothyroidism, chronic kidney disease stage V, HFpEF, dementia, history of CVA/TIA. Patient presented secondary to shortness of breath and chest pain, found to have respiratory failure secondary to AKI and heart failure exacerbation. Lasix IV initiated without significant improvement in renal function. Patient with desire for no hemodialysis. Nephrology recommended hospice. Patient now comfort measures.   Assessment & Plan:   Principal Problem:   Acute kidney injury superimposed on chronic kidney disease ([place]) Active Problems:   CKD (chronic kidney disease), stage V (HCC)   Dementia (HCC)   HTN (hypertension)   Hyperlipidemia   Hypothyroid   Comfort measures only status   Acute respiratory failure with hypoxia Secondary to fluid overload from AKI on CKD and acute heart failure. Stable on 2 L/min of oxygen. Now comfort measures. -Oxycodone prn for dyspnea  AKI on CKD stage V Nephrology consulted. Patient desire for no dialysis. Patient managed with Lasix IV with hopes for improved kidney function, however this was unsuccessful. Nephrology recommendation to transition to comfort measures. Now comfort measures.  Acute on chronic diastolic heart failure Patient managed with Lasix as managed above.  Hyponatremia Likely secondary to hypervolemia. Stable.   Dementia Continue Seroquel and Aricept. Possibly discontinue Aricept pending family decision  Primary hypertension -Continue amlodipine and Coreg. Possibly discontinue pending family decision  GERD -Continue Protonix. Possibly discontinue pending family decision  Hypothyroidism -Continue Synthroid. Possibly discontinue pending family decision  History of CVA/TIA -Continue aspirin, Plavix and Pravachol. Possibly discontinue  pending family decision  Generalized pain -Continue Tylenol, Lidocaine and diclofenac gel -Oxycodone prn  Goals of care Discussion with daughter on telephone and daughter at bedside, in addition son in law. Decision made to transition to full comfort measures. Continuing chronic medications. Will continue oxygen at 2 L/min without escalation and treat dyspnea with PRN oxycodone. Discussed discharge home with hospice, however daughter states patient wanted to stay in the hospital and have requested no discharge home. Discussed hospice facility and family declining at this time. Change code status to DNR.  DVT prophylaxis: Comfort measures Code Status:   Code Status: DNR Family Communication: Daughter and son in law at bedside. Daughter on telephone. Disposition Plan: Anticipate in-hospital death vs transfer home with hospice if family changes minds.   Consultants:  Nephrology Palliative care medicine  Procedures:  None  Antimicrobials: Ceftriaxone Azithromycin    Subjective: No concerns this afternoon per patient.  Objective: Vitals:   01/16/21 0438 01/16/21 0749 01/16/21 1159 01/16/21 1600  BP:  (!) 139/50 (!) 131/59 (!) 130/49  Pulse:  61 (!) 57 (!) 53  Resp:  16 16 15   Temp:  98.8 F (37.1 C) 98.1 F (36.7 C) 97.7 F (36.5 C)  TempSrc:      SpO2:  95% 95% 94%  Weight: 70.2 kg     Height:        Intake/Output Summary (Last 24 hours) at 01/16/2021 1801 Last data filed at 01/16/2021 1421 Gross per 24 hour  Intake 600 ml  Output 120 ml  Net 480 ml   Filed Weights   01/14/21 0126 01/15/21 0500 01/16/21 0438  Weight: 68.1 kg 70.1 kg 70.2 kg    Examination:  General exam: Appears calm and comfortable Respiratory system: Diminished on anterior auscultation. Respiratory effort normal. Cardiovascular system: S1 & S2 heard,  RRR. No murmurs, rubs, gallops or clicks. Gastrointestinal system: Abdomen is nondistended, soft and nontender. No organomegaly or masses  felt. Normal bowel sounds heard. Central nervous system: Alert. No focal neurological deficits. Musculoskeletal: No calf tenderness Skin: No cyanosis. No rashes    Data Reviewed: I have personally reviewed following labs and imaging studies  CBC Lab Results  Component Value Date   WBC 7.8 01/16/2021   RBC 2.17 (L) 01/16/2021   HGB 6.9 (L) 01/16/2021   HCT 20.5 (L) 01/16/2021   MCV 94.5 01/16/2021   MCH 31.8 01/16/2021   PLT 238 01/16/2021   MCHC 33.7 01/16/2021   RDW 12.3 01/16/2021   LYMPHSABS 1.2 01/13/2021   MONOABS 1.0 01/13/2021   EOSABS 0.1 01/13/2021   BASOSABS 0.1 35/00/9381     Last metabolic panel Lab Results  Component Value Date   NA 127 (L) 01/16/2021   K 4.5 01/16/2021   CL 94 (L) 01/16/2021   CO2 20 (L) 01/16/2021   BUN 95 (H) 01/16/2021   CREATININE 10.64 (H) 01/16/2021   GLUCOSE 86 01/16/2021   GFRNONAA 3 (L) 01/16/2021   GFRAA 12 (L) 09/29/2019   CALCIUM 7.2 (L) 01/16/2021   PHOS 9.0 (H) 01/14/2021   PROT 6.3 (L) 01/14/2021   ALBUMIN 2.8 (L) 01/14/2021   BILITOT 1.1 01/14/2021   ALKPHOS 67 01/14/2021   AST 12 (L) 01/14/2021   ALT 9 01/14/2021   ANIONGAP 13 01/16/2021    CBG (last 3)  No results for input(s): GLUCAP in the last 72 hours.   GFR: Estimated Creatinine Clearance: 3.4 mL/min (A) (by C-G formula based on SCr of 10.64 mg/dL (H)).  Coagulation Profile: No results for input(s): INR, PROTIME in the last 168 hours.  Recent Results (from the past 240 hour(s))  Resp Panel by RT-PCR (Flu A&B, Covid) Nasopharyngeal Swab     Status: None   Collection Time: 01/13/21  6:24 AM   Specimen: Nasopharyngeal Swab; Nasopharyngeal(NP) swabs in vial transport medium  Result Value Ref Range Status   SARS Coronavirus 2 by RT PCR NEGATIVE NEGATIVE Final    Comment: (NOTE) SARS-CoV-2 target nucleic acids are NOT DETECTED.  The SARS-CoV-2 RNA is generally detectable in upper respiratory specimens during the acute phase of infection. The  lowest concentration of SARS-CoV-2 viral copies this assay can detect is 138 copies/mL. A negative result does not preclude SARS-Cov-2 infection and should not be used as the sole basis for treatment or other patient management decisions. A negative result may occur with  improper specimen collection/handling, submission of specimen other than nasopharyngeal swab, presence of viral mutation(s) within the areas targeted by this assay, and inadequate number of viral copies(<138 copies/mL). A negative result must be combined with clinical observations, patient history, and epidemiological information. The expected result is Negative.  Fact Sheet for Patients:  EntrepreneurPulse.com.au  Fact Sheet for Healthcare Providers:  IncredibleEmployment.be  This test is no t yet approved or cleared by the [place] FDA and  has been authorized for detection and/or diagnosis of SARS-CoV-2 by FDA under an Emergency Use Authorization (EUA). This EUA will remain  in effect (meaning this test can be used) for the duration of the COVID-19 declaration under Section 564(b)(1) of the Act, 21 U.S.C.section 360bbb-3(b)(1), unless the authorization is terminated  or revoked sooner.       Influenza A by PCR NEGATIVE NEGATIVE Final   Influenza B by PCR NEGATIVE NEGATIVE Final    Comment: (NOTE) The Xpert Xpress SARS-CoV-2/FLU/RSV plus assay is  intended as an aid in the diagnosis of influenza from Nasopharyngeal swab specimens and should not be used as a sole basis for treatment. Nasal washings and aspirates are unacceptable for Xpert Xpress SARS-CoV-2/FLU/RSV testing.  Fact Sheet for Patients: EntrepreneurPulse.com.au  Fact Sheet for Healthcare Providers: IncredibleEmployment.be  This test is not yet approved or cleared by the [place] FDA and has been authorized for detection and/or diagnosis of SARS-CoV-2 by FDA under  an Emergency Use Authorization (EUA). This EUA will remain in effect (meaning this test can be used) for the duration of the COVID-19 declaration under Section 564(b)(1) of the Act, 21 U.S.C. section 360bbb-3(b)(1), unless the authorization is terminated or revoked.  Performed at [facility], [address] [place], [place] [address]         Radiology Studies: No results found.      Scheduled Meds:  acetaminophen (TYLENOL) oral liquid 160 mg/5 mL  1,000 mg Oral Q6H   amLODipine  10 mg Oral Daily   aspirin EC  81 mg Oral Daily   calcium carbonate  1 tablet Oral BID WC   carvedilol  25 mg Oral BID WC   donepezil  10 mg Oral q morning   fluticasone furoate-vilanterol  1 puff Inhalation Daily   guaiFENesin  5 mL Oral Q6H   heparin  5,000 Units Subcutaneous Q8H   levothyroxine  25 mcg Oral QAC breakfast   lidocaine  2 patch Transdermal Q24H   pantoprazole  80 mg Oral Q1200   pravastatin  40 mg Oral QPM   QUEtiapine  12.5 mg Oral BID   Continuous Infusions:   LOS: 3 days     Cordelia Poche, MD [facility] 01/16/2021, 6:01 PM  If 7PM-7AM, please contact night-coverage [email or web address]

## 2021-01-17 NOTE — Progress Notes (Signed)
PROGRESS NOTE    [name]  ZOX:[id number] DOB: Nov 23, 1929 DOA: 01/13/2021 PCP: Danae Orleans, MD   Brief Narrative: Kristi Brown is a 87 y.o. female with a history of GERD, hypertension, hypothyroidism, chronic kidney disease stage V, HFpEF, dementia, history of CVA/TIA. Patient presented secondary to shortness of breath and chest pain, found to have respiratory failure secondary to AKI and heart failure exacerbation. Lasix IV initiated without significant improvement in renal function. Patient with desire for no hemodialysis. Nephrology recommended hospice. Patient now comfort measures.   Assessment & Plan:   Principal Problem:   Acute kidney injury superimposed on chronic kidney disease ([place]) Active Problems:   CKD (chronic kidney disease), stage V (HCC)   Dementia (HCC)   HTN (hypertension)   Hyperlipidemia   Hypothyroid   Comfort measures only status   Acute respiratory failure with hypoxia Secondary to fluid overload from AKI on CKD and acute heart failure. Stable on 2 L/min of oxygen. Now comfort measures. -Oxycodone prn for dyspnea  AKI on CKD stage V Nephrology consulted. Patient desire for no dialysis. Patient managed with Lasix IV with hopes for improved kidney function, however this was unsuccessful. Nephrology recommendation to transition to comfort measures. Now comfort measures.  Acute on chronic diastolic heart failure Patient managed with Lasix as managed above.  Hyponatremia Likely secondary to hypervolemia. Stable.   Dementia Continue Seroquel and Aricept. Possibly discontinue Aricept pending family decision  Primary hypertension -Continue amlodipine and Coreg. Possibly discontinue pending family decision  GERD -Continue Protonix. Possibly discontinue pending family decision  Hypothyroidism -Continue Synthroid. Possibly discontinue pending family decision  History of CVA/TIA -Continue aspirin, Plavix and Pravachol. Possibly discontinue  pending family decision  Generalized pain -Continue Tylenol, Lidocaine and diclofenac gel -Oxycodone prn  Goals of care Discussion with daughter on telephone and daughter at bedside, in addition son in law on 12/28. Decision made to transition to full comfort measures. Continuing chronic medications. Will continue oxygen at 2 L/min without escalation and treat dyspnea with PRN oxycodone. Discussed discharge home with hospice, however daughter states patient wanted to stay in the hospital and have requested no discharge home. Discussed hospice facility and family declining at this time. Change code status to DNR.  DVT prophylaxis: Comfort measures Code Status:   Code Status: DNR Family Communication: Daughter at bedside Disposition Plan: Anticipate in-hospital death vs transfer home with hospice if family changes their mind about transfer.   Consultants:  Nephrology Palliative care medicine  Procedures:  None  Antimicrobials: Ceftriaxone Azithromycin    Subjective: Resting comfortably  Objective: Vitals:   01/16/21 1159 01/16/21 1600 01/16/21 2311 01/17/21 0751  BP: (!) 131/59 (!) 130/49 (!) 110/41 (!) 128/47  Pulse: (!) 57 (!) 53 (!) 53 (!) 54  Resp: 16 15 16 18   Temp: 98.1 F (36.7 C) 97.7 F (36.5 C) (!) 97.4 F (36.3 C) 97.7 F (36.5 C)  TempSrc:   Oral   SpO2: 95% 94% 97% (!) 89%  Weight:      Height:        Intake/Output Summary (Last 24 hours) at 01/17/2021 1456 Last data filed at 01/17/2021 0950 Gross per 24 hour  Intake 340 ml  Output 350 ml  Net -10 ml    Filed Weights   01/14/21 0126 01/15/21 0500 01/16/21 0438  Weight: 68.1 kg 70.1 kg 70.2 kg    Examination:  General: Well appearing, no distress    Data Reviewed: I have personally reviewed following labs and imaging studies  CBC Lab Results  Component Value Date   WBC 7.8 01/16/2021   RBC 2.17 (L) 01/16/2021   HGB 6.9 (L) 01/16/2021   HCT 20.5 (L) 01/16/2021   MCV 94.5 01/16/2021    MCH 31.8 01/16/2021   PLT 238 01/16/2021   MCHC 33.7 01/16/2021   RDW 12.3 01/16/2021   LYMPHSABS 1.2 01/13/2021   MONOABS 1.0 01/13/2021   EOSABS 0.1 01/13/2021   BASOSABS 0.1 24/23/5361     Last metabolic panel Lab Results  Component Value Date   NA 127 (L) 01/16/2021   K 4.5 01/16/2021   CL 94 (L) 01/16/2021   CO2 20 (L) 01/16/2021   BUN 95 (H) 01/16/2021   CREATININE 10.64 (H) 01/16/2021   GLUCOSE 86 01/16/2021   GFRNONAA 3 (L) 01/16/2021   GFRAA 12 (L) 09/29/2019   CALCIUM 7.2 (L) 01/16/2021   PHOS 9.0 (H) 01/14/2021   PROT 6.3 (L) 01/14/2021   ALBUMIN 2.8 (L) 01/14/2021   BILITOT 1.1 01/14/2021   ALKPHOS 67 01/14/2021   AST 12 (L) 01/14/2021   ALT 9 01/14/2021   ANIONGAP 13 01/16/2021    CBG (last 3)  No results for input(s): GLUCAP in the last 72 hours.   GFR: Estimated Creatinine Clearance: 3.4 mL/min (A) (by C-G formula based on SCr of 10.64 mg/dL (H)).  Coagulation Profile: No results for input(s): INR, PROTIME in the last 168 hours.  Recent Results (from the past 240 hour(s))  Resp Panel by RT-PCR (Flu A&B, Covid) Nasopharyngeal Swab     Status: None   Collection Time: 01/13/21  6:24 AM   Specimen: Nasopharyngeal Swab; Nasopharyngeal(NP) swabs in vial transport medium  Result Value Ref Range Status   SARS Coronavirus 2 by RT PCR NEGATIVE NEGATIVE Final    Comment: (NOTE) SARS-CoV-2 target nucleic acids are NOT DETECTED.  The SARS-CoV-2 RNA is generally detectable in upper respiratory specimens during the acute phase of infection. The lowest concentration of SARS-CoV-2 viral copies this assay can detect is 138 copies/mL. A negative result does not preclude SARS-Cov-2 infection and should not be used as the sole basis for treatment or other patient management decisions. A negative result may occur with  improper specimen collection/handling, submission of specimen other than nasopharyngeal swab, presence of viral mutation(s) within the areas  targeted by this assay, and inadequate number of viral copies(<138 copies/mL). A negative result must be combined with clinical observations, patient history, and epidemiological information. The expected result is Negative.  Fact Sheet for Patients:  EntrepreneurPulse.com.au  Fact Sheet for Healthcare Providers:  IncredibleEmployment.be  This test is no t yet approved or cleared by the [place] FDA and  has been authorized for detection and/or diagnosis of SARS-CoV-2 by FDA under an Emergency Use Authorization (EUA). This EUA will remain  in effect (meaning this test can be used) for the duration of the COVID-19 declaration under Section 564(b)(1) of the Act, 21 U.S.C.section 360bbb-3(b)(1), unless the authorization is terminated  or revoked sooner.       Influenza A by PCR NEGATIVE NEGATIVE Final   Influenza B by PCR NEGATIVE NEGATIVE Final    Comment: (NOTE) The Xpert Xpress SARS-CoV-2/FLU/RSV plus assay is intended as an aid in the diagnosis of influenza from Nasopharyngeal swab specimens and should not be used as a sole basis for treatment. Nasal washings and aspirates are unacceptable for Xpert Xpress SARS-CoV-2/FLU/RSV testing.  Fact Sheet for Patients: EntrepreneurPulse.com.au  Fact Sheet for Healthcare Providers: IncredibleEmployment.be  This test is not yet approved or  cleared by the [place] and has been authorized for detection and/or diagnosis of SARS-CoV-2 by FDA under an Emergency Use Authorization (EUA). This EUA will remain in effect (meaning this test can be used) for the duration of the COVID-19 declaration under Section 564(b)(1) of the Act, 21 U.S.C. section 360bbb-3(b)(1), unless the authorization is terminated or revoked.  Performed at [facility], [address] [place], [place] [address]         Radiology Studies: No results  found.      Scheduled Meds:  acetaminophen (TYLENOL) oral liquid 160 mg/5 mL  1,000 mg Oral Q6H   amLODipine  10 mg Oral Daily   aspirin EC  81 mg Oral Daily   calcium carbonate  1 tablet Oral BID WC   carvedilol  25 mg Oral BID WC   donepezil  10 mg Oral q morning   fluticasone furoate-vilanterol  1 puff Inhalation Daily   guaiFENesin  5 mL Oral Q6H   heparin  5,000 Units Subcutaneous Q8H   levothyroxine  25 mcg Oral QAC breakfast   lidocaine  2 patch Transdermal Q24H   pantoprazole  80 mg Oral Q1200   pravastatin  40 mg Oral QPM   QUEtiapine  12.5 mg Oral BID   Continuous Infusions:   LOS: 4 days     Cordelia Poche, MD [facility] 01/17/2021, 2:56 PM  If 7PM-7AM, please contact night-coverage [email or web address]

## 2021-01-18 MED ORDER — HYDROMORPHONE HCL 1 MG/ML IJ SOLN
0.5000 mg | INTRAMUSCULAR | Status: DC | PRN
  Administered 2021-01-18 – 2021-01-22 (×17): 0.5 mg via INTRAVENOUS
  Filled 2021-01-18 (×18): qty 1

## 2021-01-18 NOTE — Progress Notes (Signed)
Palliative Care Progress Note, Assessment & Plan   Patient Name: Kristi Brown       Date: 01/18/2021 DOB: [birth year]  Age: 87 y.o. MRN#: [id number] Attending Physician: Mariel Aloe, MD Primary Care Physician: Danae Orleans, MD Admit Date: 01/13/2021  Reason for Consultation/Follow-up: Establishing goals of care  Subjective: Patient is sitting in bed with eyes closed and mouth open with tongue protruding.  She appears to be in no distress.  Many family members are at bedside.  HPI: Patient is an 87 year old female with a past medical history significant for GERD, HTN, hypothyroidism, CKD (stage V), HFpEF, dementia, history of CVA and TIA who presented with shortness of breath and chest pain.  Patient was found to be in respiratory failure with AKI and a heart failure exacerbation.  Nephrology was consulted.  Patient had no desire for hemodialysis.  Nephrology recommended hospice.  Patient has been transitioned to comfort measures.  My colleague NP Asencion Gowda met with family on 12/28 at which time family was to notify [facility] when they were ready to transfer the patient to hospice services.  I was notified by nursing today that family is continuing to try to give p.o. medications and does not seem to be fully comprehending a comfort care pathway.  They also have not reached out to [facility] to initiate hospice referral.  Summary of counseling/coordination of care: Discussed with family that plan is for full comfort measures.  Comfort measures outlined in detail.  Family was in agreement to move forward with strictly comfort plan.  Family made their wishes clear that they do not want the patient transferred to a hospice inpatient facility.  They also do not want to have the patient return home with hospice  services.  They would like to have the patient remain in the hospital as she transitions to end-of-life.  I reviewed that the patient is using supplemental oxygen - a life sustaining measure. I shared that like any other medication this is not focused on her comfort but rather prolonging her life.  I removed the oxygen from the patient.  Therapeutic silence and active listening provided for family to share their thoughts and emotions regarding the patient's current health status.  I encouraged family to not force-feed the patient.  Should the patient awaken and be alert enough to take in p.o.'s then she would be given what ever comfort feeds.  Family was appropriately tearful but understood.  I shared that medications are in place to be given as needed for anxiety, agitation, pain, air hunger, and any other symptoms that may make the patient uncomfortable.  Palliative medicine team will continue to monitor the patient peripherally.  PMT will be available to the family and medical team as needed. PLease secure chat or use amion to reach PMT providers for any further PMT needs.  Code Status: DNR  Prognosis: < 2 weeks  Discharge Planning: Anticipated Hospital Death  Recommendations/Plan: DNR Full comfort measures Do not replace supplemental oxygen Do not force p.o. intake unless patient is alert and asks for it Unrestricted visitor access Comfort cart for family Family declined spiritual care at this time  Care plan was discussed with patient, patient's family, nursing,  Dr. Lonny Prude  Physical Exam Constitutional:      General: She is not in acute distress.    Appearance: She is not toxic-appearing.     Comments: somnolent  HENT:     Head: Normocephalic and atraumatic.  Cardiovascular:     Rate and Rhythm: Normal rate.  Pulmonary:     Effort: Pulmonary effort is normal.  Abdominal:     Palpations: Abdomen is soft.  Musculoskeletal:     Comments: Generalized weakness  Skin:     General: Skin is warm and dry.  Neurological:     Comments: nonverbal  Psychiatric:        Behavior: Behavior is not agitated.               Total Time 15 minutes  Greater than 50%  of this time was spent counseling and coordinating care related to the above assessment and plan.  Thank you for allowing the Palliative Medicine Team to assist in the care of this patient.  [place] Ilsa Iha, FNP-BC Palliative Medicine Team Team Phone # [phone]

## 2021-01-18 NOTE — TOC Progression Note (Signed)
Transition of Care [facility]) - Progression Note    Patient Details  Name: Kristi Brown MRN: [id number] Date of Birth: [birth year]  [facility] CM/SW [place], RN Phone Number: 01/18/2021, 4:55 PM  Clinical Narrative:   Comfort care, [facility] to follow up saturday         Expected Discharge Plan and Services                                                 Social Determinants of Health (SDOH) Interventions    Readmission Risk Interventions No flowsheet data found.

## 2021-01-18 NOTE — Progress Notes (Signed)
Received verbal order from Vivi Barrack NP  to discontinue the liquid Tylenol  1000 mg  every six hours.

## 2021-01-18 NOTE — Progress Notes (Signed)
PROGRESS NOTE    COMFORT IVERSEN  ZOX:[id number] DOB: 1929/12/27 DOA: 01/13/2021 PCP: Danae Orleans, MD   Brief Narrative: Kristi Brown is a 87 y.o. female with a history of GERD, hypertension, hypothyroidism, chronic kidney disease stage V, HFpEF, dementia, history of CVA/TIA. Patient presented secondary to shortness of breath and chest pain, found to have respiratory failure secondary to AKI and heart failure exacerbation. Lasix IV initiated without significant improvement in renal function. Patient with desire for no hemodialysis. Nephrology recommended hospice. Patient now comfort measures.   Assessment & Plan:   Principal Problem:   Acute kidney injury superimposed on chronic kidney disease ([place]) Active Problems:   CKD (chronic kidney disease), stage V (HCC)   Dementia (HCC)   HTN (hypertension)   Hyperlipidemia   Hypothyroid   Comfort measures only status   Acute respiratory failure with hypoxia Secondary to fluid overload from AKI on CKD and acute heart failure. Stable on 2 L/min of oxygen. Now comfort measures. -Oxycodone prn for dyspnea  AKI on CKD stage V Nephrology consulted. Patient desire for no dialysis. Patient managed with Lasix IV with hopes for improved kidney function, however this was unsuccessful. Nephrology recommendation to transition to comfort measures. Now comfort measures.  Acute on chronic diastolic heart failure Patient managed with Lasix as managed above. Now comfort measures.  Hyponatremia Likely secondary to hypervolemia. Stable.   Dementia -Discontinue Seroquel and Aricept.   Primary hypertension -Discontinue amlodipine and Coreg.  GERD -Discontinue Protonix.  Hypothyroidism -Discontinue Synthroid.  History of CVA/TIA -Discontinue aspirin, Plavix and Pravachol.  Generalized pain -Continue Tylenol, Lidocaine and diclofenac gel -Oxycodone prn -Dilaudid 0.5 mg IV prn for severe pain  Goals of care Discussion with daughter on  telephone and daughter at bedside, in addition son in law on 12/28. Decision made to transition to full comfort measures. Continuing chronic medications. Will continue oxygen at 2 L/min without escalation and treat dyspnea with PRN oxycodone. Discussed discharge home with hospice, however daughter states patient wanted to stay in the hospital and have requested no discharge home. Discussed hospice facility and family declining at this time. Change code status to DNR.  DVT prophylaxis: Comfort measures Code Status:   Code Status: DNR Family Communication: Daughter at bedside Disposition Plan: Anticipate in-hospital death   Consultants:  Nephrology Palliative care medicine  Procedures:  None  Antimicrobials: Ceftriaxone Azithromycin    Subjective: Some pain. Otherwise, no concerns.  Objective: Vitals:   01/16/21 2311 01/17/21 0751 01/17/21 1728 01/18/21 0838  BP: (!) 110/41 (!) 128/47 (!) 120/42 (!) 126/43  Pulse: (!) 53 (!) 54 (!) 51 (!) 55  Resp: 16 18 17    Temp: (!) 97.4 F (36.3 C) 97.7 F (36.5 C) 98.3 F (36.8 C) (!) 97.4 F (36.3 C)  TempSrc: Oral   Oral  SpO2: 97% (!) 89% (!) 76% 100%  Weight:      Height:        Intake/Output Summary (Last 24 hours) at 01/18/2021 0936 Last data filed at 01/18/2021 0500 Gross per 24 hour  Intake 0 ml  Output 200 ml  Net -200 ml    Filed Weights   01/14/21 0126 01/15/21 0500 01/16/21 0438  Weight: 68.1 kg 70.1 kg 70.2 kg    Examination:  General: Well appearing, no distress    Data Reviewed: I have personally reviewed following labs and imaging studies  CBC Lab Results  Component Value Date   WBC 7.8 01/16/2021   RBC 2.17 (L) 01/16/2021  HGB 6.9 (L) 01/16/2021   HCT 20.5 (L) 01/16/2021   MCV 94.5 01/16/2021   MCH 31.8 01/16/2021   PLT 238 01/16/2021   MCHC 33.7 01/16/2021   RDW 12.3 01/16/2021   LYMPHSABS 1.2 01/13/2021   MONOABS 1.0 01/13/2021   EOSABS 0.1 01/13/2021   BASOSABS 0.1 01/13/2021      Last metabolic panel Lab Results  Component Value Date   NA 127 (L) 01/16/2021   K 4.5 01/16/2021   CL 94 (L) 01/16/2021   CO2 20 (L) 01/16/2021   BUN 95 (H) 01/16/2021   CREATININE 10.64 (H) 01/16/2021   GLUCOSE 86 01/16/2021   GFRNONAA 3 (L) 01/16/2021   GFRAA 12 (L) 09/29/2019   CALCIUM 7.2 (L) 01/16/2021   PHOS 9.0 (H) 01/14/2021   PROT 6.3 (L) 01/14/2021   ALBUMIN 2.8 (L) 01/14/2021   BILITOT 1.1 01/14/2021   ALKPHOS 67 01/14/2021   AST 12 (L) 01/14/2021   ALT 9 01/14/2021   ANIONGAP 13 01/16/2021    CBG (last 3)  No results for input(s): GLUCAP in the last 72 hours.   GFR: Estimated Creatinine Clearance: 3.4 mL/min (A) (by C-G formula based on SCr of 10.64 mg/dL (H)).  Coagulation Profile: No results for input(s): INR, PROTIME in the last 168 hours.  Recent Results (from the past 240 hour(s))  Resp Panel by RT-PCR (Flu A&B, Covid) Nasopharyngeal Swab     Status: None   Collection Time: 01/13/21  6:24 AM   Specimen: Nasopharyngeal Swab; Nasopharyngeal(NP) swabs in vial transport medium  Result Value Ref Range Status   SARS Coronavirus 2 by RT PCR NEGATIVE NEGATIVE Final    Comment: (NOTE) SARS-CoV-2 target nucleic acids are NOT DETECTED.  The SARS-CoV-2 RNA is generally detectable in upper respiratory specimens during the acute phase of infection. The lowest concentration of SARS-CoV-2 viral copies this assay can detect is 138 copies/mL. A negative result does not preclude SARS-Cov-2 infection and should not be used as the sole basis for treatment or other patient management decisions. A negative result may occur with  improper specimen collection/handling, submission of specimen other than nasopharyngeal swab, presence of viral mutation(s) within the areas targeted by this assay, and inadequate number of viral copies(<138 copies/mL). A negative result must be combined with clinical observations, patient history, and epidemiological information. The  expected result is Negative.  Fact Sheet for Patients:  EntrepreneurPulse.com.au  Fact Sheet for Healthcare Providers:  IncredibleEmployment.be  This test is no t yet approved or cleared by the [place] FDA and  has been authorized for detection and/or diagnosis of SARS-CoV-2 by FDA under an Emergency Use Authorization (EUA). This EUA will remain  in effect (meaning this test can be used) for the duration of the COVID-19 declaration under Section 564(b)(1) of the Act, 21 U.S.C.section 360bbb-3(b)(1), unless the authorization is terminated  or revoked sooner.       Influenza A by PCR NEGATIVE NEGATIVE Final   Influenza B by PCR NEGATIVE NEGATIVE Final    Comment: (NOTE) The Xpert Xpress SARS-CoV-2/FLU/RSV plus assay is intended as an aid in the diagnosis of influenza from Nasopharyngeal swab specimens and should not be used as a sole basis for treatment. Nasal washings and aspirates are unacceptable for Xpert Xpress SARS-CoV-2/FLU/RSV testing.  Fact Sheet for Patients: EntrepreneurPulse.com.au  Fact Sheet for Healthcare Providers: IncredibleEmployment.be  This test is not yet approved or cleared by the [place] FDA and has been authorized for detection and/or diagnosis of SARS-CoV-2 by FDA under an  Emergency Use Authorization (EUA). This EUA will remain in effect (meaning this test can be used) for the duration of the COVID-19 declaration under Section 564(b)(1) of the Act, 21 U.S.C. section 360bbb-3(b)(1), unless the authorization is terminated or revoked.  Performed at [facility], [address] [place], [place] [address]         Radiology Studies: No results found.      Scheduled Meds:  acetaminophen (TYLENOL) oral liquid 160 mg/5 mL  1,000 mg Oral Q6H   amLODipine  10 mg Oral Daily   aspirin EC  81 mg Oral Daily   calcium carbonate  1 tablet Oral BID WC    carvedilol  25 mg Oral BID WC   donepezil  10 mg Oral q morning   fluticasone furoate-vilanterol  1 puff Inhalation Daily   guaiFENesin  5 mL Oral Q6H   heparin  5,000 Units Subcutaneous Q8H   levothyroxine  25 mcg Oral QAC breakfast   lidocaine  2 patch Transdermal Q24H   pantoprazole  80 mg Oral Q1200   pravastatin  40 mg Oral QPM   QUEtiapine  12.5 mg Oral BID   Continuous Infusions:   LOS: 5 days     Cordelia Poche, MD [facility] 01/18/2021, 9:36 AM  If 7PM-7AM, please contact night-coverage [email or web address]

## 2021-01-18 NOTE — Care Management Important Message (Signed)
Important Message  Patient Details  Name: Kristi Brown MRN: [id number] Date of Birth: Jul 10, 1929   Medicare Important Message Given:  Other (see comment)  Patient is comfort care and MD anticipates in-hospital death or home with Hospice if family changes their mind. Out of respect for the patient and family no Important Message from [facility] given.    Juliann Pulse A Jarrell Armond 01/18/2021, 8:08 AM

## 2021-01-19 DIAGNOSIS — F0280 Dementia in other diseases classified elsewhere without behavioral disturbance: Secondary | ICD-10-CM

## 2021-01-19 DIAGNOSIS — G309 Alzheimer's disease, unspecified: Secondary | ICD-10-CM

## 2021-01-19 DIAGNOSIS — Z66 Do not resuscitate: Secondary | ICD-10-CM

## 2021-01-19 NOTE — TOC Progression Note (Signed)
Transition of Care [facility]) - Progression Note    Patient Details  Name: Kristi Brown MRN: [id number] Date of Birth: 1929/12/29  [facility]) CM/SW [place], LCSW Phone Number: 01/19/2021, 11:05 AM  Clinical Narrative:    Followed up with [name] with [facility] regarding TOC note that [facility] will follow up today. [facility] will not be following as per Palliative NP note: "Family made their wishes clear that they do not want the patient transferred to a hospice inpatient facility.  They also do not want to have the patient return home with hospice services.  They would like to have the patient remain in the hospital as she transitions to end-of-life."        Expected Discharge Plan and Services                                                 Social Determinants of Health (SDOH) Interventions    Readmission Risk Interventions No flowsheet data found.

## 2021-01-19 NOTE — Progress Notes (Signed)
PROGRESS NOTE    [name]  VOH:[id number] DOB: Sep 08, 1929 DOA: 01/13/2021 PCP: Danae Orleans, MD   Brief Narrative: Kristi Brown is a 87 y.o. female with a history of GERD, hypertension, hypothyroidism, chronic kidney disease stage V, HFpEF, dementia, history of CVA/TIA. Patient presented secondary to shortness of breath and chest pain, found to have respiratory failure secondary to AKI and heart failure exacerbation. Lasix IV initiated without significant improvement in renal function. Patient with desire for no hemodialysis. Nephrology recommended hospice. Patient now comfort measures.   Assessment & Plan:   Principal Problem:   Acute kidney injury superimposed on chronic kidney disease ([place]) Active Problems:   CKD (chronic kidney disease), stage V (HCC)   Dementia (HCC)   HTN (hypertension)   Hyperlipidemia   Hypothyroid   Comfort measures only status   Acute respiratory failure with hypoxia Secondary to fluid overload from AKI on CKD and acute heart failure. Stable on 2 L/min of oxygen. Now comfort measures. -Oxycodone prn for dyspnea -Dilaudid IV prn for dyspnea if unable to take PO  AKI on CKD stage V Nephrology consulted. Patient desire for no dialysis. Patient managed with Lasix IV with hopes for improved kidney function, however this was unsuccessful. Nephrology recommendation to transition to comfort measures. Now comfort measures.  Acute on chronic diastolic heart failure Patient managed with Lasix as managed above. Now comfort measures.  Hyponatremia Likely secondary to hypervolemia. Stable.   Dementia Discontinued Seroquel and Aricept. Comfort measures  Primary hypertension Discontinued amlodipine and Coreg. Comfort measures  GERD Discontinued Protonix. Comfort measures  Hypothyroidism Discontinued Synthroid. Comfort measures  History of CVA/TIA Discontinued aspirin, Plavix and Pravachol. Comfort measures  Generalized pain -Continue Tylenol  (if able), Lidocaine and diclofenac gel -Oxycodone prn (if able) -Dilaudid 0.5 mg IV prn for severe pain  Goals of care Discussion with daughter on telephone and daughter at bedside, in addition son in law on 12/28. Decision made to transition to full comfort measures. Continuing chronic medications. Will continue oxygen at 2 L/min without escalation and treat dyspnea with PRN oxycodone and PRN dilaudid IV. Discussed discharge home with hospice, however daughter states patient wanted to stay in the hospital and have requested no discharge home. Discussed hospice facility and family declining at this time. Change code status to DNR.  DVT prophylaxis: Comfort measures Code Status:   Code Status: DNR Family Communication: Daughter at bedside Disposition Plan: Anticipate in-hospital death   Consultants:  Nephrology Palliative care medicine  Procedures:  None  Antimicrobials: Ceftriaxone Azithromycin    Subjective: Some jerks and agitation overnight. Agitation improved with analgesics.  Objective: Vitals:   01/17/21 0751 01/17/21 1728 01/18/21 0838 01/19/21 0504  BP: (!) 128/47 (!) 120/42 (!) 126/43 (!) 120/49  Pulse: (!) 54 (!) 51 (!) 55 70  Resp: 18 17  18   Temp: 97.7 F (36.5 C) 98.3 F (36.8 C) (!) 97.4 F (36.3 C) 98.1 F (36.7 C)  TempSrc:   Oral Oral  SpO2: (!) 89% (!) 76% 100% (!) 72%  Weight:      Height:       No intake or output data in the 24 hours ending 01/19/21 0918  Filed Weights   01/14/21 0126 01/15/21 0500 01/16/21 0438  Weight: 68.1 kg 70.1 kg 70.2 kg    Examination:  General: Well appearing, no distress. Infrequent jerking noted.    Data Reviewed: I have personally reviewed following labs and imaging studies  CBC Lab Results  Component Value Date  WBC 7.8 01/16/2021   RBC 2.17 (L) 01/16/2021   HGB 6.9 (L) 01/16/2021   HCT 20.5 (L) 01/16/2021   MCV 94.5 01/16/2021   MCH 31.8 01/16/2021   PLT 238 01/16/2021   MCHC 33.7 01/16/2021    RDW 12.3 01/16/2021   LYMPHSABS 1.2 01/13/2021   MONOABS 1.0 01/13/2021   EOSABS 0.1 01/13/2021   BASOSABS 0.1 71/69/6789     Last metabolic panel Lab Results  Component Value Date   NA 127 (L) 01/16/2021   K 4.5 01/16/2021   CL 94 (L) 01/16/2021   CO2 20 (L) 01/16/2021   BUN 95 (H) 01/16/2021   CREATININE 10.64 (H) 01/16/2021   GLUCOSE 86 01/16/2021   GFRNONAA 3 (L) 01/16/2021   GFRAA 12 (L) 09/29/2019   CALCIUM 7.2 (L) 01/16/2021   PHOS 9.0 (H) 01/14/2021   PROT 6.3 (L) 01/14/2021   ALBUMIN 2.8 (L) 01/14/2021   BILITOT 1.1 01/14/2021   ALKPHOS 67 01/14/2021   AST 12 (L) 01/14/2021   ALT 9 01/14/2021   ANIONGAP 13 01/16/2021    Recent Results (from the past 240 hour(s))  Resp Panel by RT-PCR (Flu A&B, Covid) Nasopharyngeal Swab     Status: None   Collection Time: 01/13/21  6:24 AM   Specimen: Nasopharyngeal Swab; Nasopharyngeal(NP) swabs in vial transport medium  Result Value Ref Range Status   SARS Coronavirus 2 by RT PCR NEGATIVE NEGATIVE Final    Comment: (NOTE) SARS-CoV-2 target nucleic acids are NOT DETECTED.  The SARS-CoV-2 RNA is generally detectable in upper respiratory specimens during the acute phase of infection. The lowest concentration of SARS-CoV-2 viral copies this assay can detect is 138 copies/mL. A negative result does not preclude SARS-Cov-2 infection and should not be used as the sole basis for treatment or other patient management decisions. A negative result may occur with  improper specimen collection/handling, submission of specimen other than nasopharyngeal swab, presence of viral mutation(s) within the areas targeted by this assay, and inadequate number of viral copies(<138 copies/mL). A negative result must be combined with clinical observations, patient history, and epidemiological information. The expected result is Negative.  Fact Sheet for Patients:  EntrepreneurPulse.com.au  Fact Sheet for Healthcare Providers:   IncredibleEmployment.be  This test is no t yet approved or cleared by the [place] FDA and  has been authorized for detection and/or diagnosis of SARS-CoV-2 by FDA under an Emergency Use Authorization (EUA). This EUA will remain  in effect (meaning this test can be used) for the duration of the COVID-19 declaration under Section 564(b)(1) of the Act, 21 U.S.C.section 360bbb-3(b)(1), unless the authorization is terminated  or revoked sooner.       Influenza A by PCR NEGATIVE NEGATIVE Final   Influenza B by PCR NEGATIVE NEGATIVE Final    Comment: (NOTE) The Xpert Xpress SARS-CoV-2/FLU/RSV plus assay is intended as an aid in the diagnosis of influenza from Nasopharyngeal swab specimens and should not be used as a sole basis for treatment. Nasal washings and aspirates are unacceptable for Xpert Xpress SARS-CoV-2/FLU/RSV testing.  Fact Sheet for Patients: EntrepreneurPulse.com.au  Fact Sheet for Healthcare Providers: IncredibleEmployment.be  This test is not yet approved or cleared by the [place] FDA and has been authorized for detection and/or diagnosis of SARS-CoV-2 by FDA under an Emergency Use Authorization (EUA). This EUA will remain in effect (meaning this test can be used) for the duration of the COVID-19 declaration under Section 564(b)(1) of the Act, 21 U.S.C. section 360bbb-3(b)(1), unless the authorization is  terminated or revoked.  Performed at [facility], [address], [place], [place] [address]         Radiology Studies: No results found.      Scheduled Meds:  lidocaine  2 patch Transdermal Q24H   Continuous Infusions:   LOS: 6 days     Cordelia Poche, MD [facility] 01/19/2021, 9:18 AM  If 7PM-7AM, please contact night-coverage [email or web address]

## 2021-01-20 NOTE — Progress Notes (Signed)
PROGRESS NOTE    Kristi Brown  XBM:[id number] DOB: Jun 28, 1929 DOA: 01/13/2021 PCP: Danae Orleans, MD   Brief Narrative: Kristi Brown is a 88 y.o. female with a history of GERD, hypertension, hypothyroidism, chronic kidney disease stage V, HFpEF, dementia, history of CVA/TIA. Patient presented secondary to shortness of breath and chest pain, found to have respiratory failure secondary to AKI and heart failure exacerbation. Lasix IV initiated without significant improvement in renal function. Patient with desire for no hemodialysis. Nephrology recommended hospice. Patient now comfort measures. Family is very adamant that patient has to stay in hospital-do not want transfer to hospice home. Palliative care to continue discussion as it is inappropriate to keep a stable patient for end-of-life care in acute care hospital.  Subjective: Patient was resting comfortably when seen today.  Multiple family members at bedside.  Assessment & Plan:   Principal Problem:   Acute kidney injury superimposed on chronic kidney disease ([place]) Active Problems:   CKD (chronic kidney disease), stage V (HCC)   Dementia (HCC)   HTN (hypertension)   Hyperlipidemia   Hypothyroid   Comfort measures only status   Acute respiratory failure with hypoxia Secondary to fluid overload from AKI on CKD and acute heart failure. Stable on 2 L/min of oxygen. Now comfort measures. -Oxycodone prn for dyspnea -Dilaudid IV prn for dyspnea if unable to take PO  AKI on CKD stage V Nephrology consulted. Patient desire for no dialysis. Patient managed with Lasix IV with hopes for improved kidney function, however this was unsuccessful. Nephrology recommendation to transition to comfort measures. Now comfort measures.  Acute on chronic diastolic heart failure Patient managed with Lasix as managed above. Now comfort measures.  Hyponatremia Likely secondary to hypervolemia. Stable.   Dementia Discontinued Seroquel and  Aricept. Comfort measures  Primary hypertension Discontinued amlodipine and Coreg. Comfort measures  GERD Discontinued Protonix. Comfort measures  Hypothyroidism Discontinued Synthroid. Comfort measures  History of CVA/TIA Discontinued aspirin, Plavix and Pravachol. Comfort measures  Generalized pain -Continue Tylenol (if able), Lidocaine and diclofenac gel -Oxycodone prn (if able) -Dilaudid 0.5 mg IV prn for severe pain  Goals of care Discussion with daughter on telephone and daughter at bedside, in addition son in law on 12/28. Decision made to transition to full comfort measures. Continuing chronic medications. Will continue oxygen at 2 L/min without escalation and treat dyspnea with PRN oxycodone and PRN dilaudid IV. Discussed discharge home with hospice, however daughter states patient wanted to stay in the hospital and have requested no discharge home. Discussed hospice facility and family declining at this time. Change code status to DNR.  DVT prophylaxis: Comfort measures Code Status:   Code Status: DNR Family Communication: Multiple family members at bedside. Disposition Plan: Anticipate in-hospital death   Consultants:  Nephrology Palliative care medicine  Procedures:  None  Antimicrobials:   Objective: Vitals:   01/17/21 1728 01/18/21 0838 01/19/21 0504 01/20/21 0516  BP: (!) 120/42 (!) 126/43 (!) 120/49 (!) 112/43  Pulse: (!) 51 (!) 55 70 61  Resp: 17  18 12   Temp: 98.3 F (36.8 C) (!) 97.4 F (36.3 C) 98.1 F (36.7 C) 97.7 F (36.5 C)  TempSrc:  Oral Oral Oral  SpO2: (!) 76% 100% (!) 72% (!) 84%  Weight:      Height:        Intake/Output Summary (Last 24 hours) at 01/20/2021 1731 Last data filed at 01/20/2021 1300 Gross per 24 hour  Intake 0 ml  Output --  Net  0 ml    Filed Weights   01/14/21 0126 01/15/21 0500 01/16/21 0438  Weight: 68.1 kg 70.1 kg 70.2 kg    Examination:  General.  Frail elderly lady, in no acute distress. Pulmonary.   Lungs clear bilaterally, normal respiratory effort. CV.  Regular rate and rhythm, no JVD, rub or murmur. Abdomen.  Soft, nontender, nondistended, BS positive. Extremities.  No edema, no cyanosis, pulses intact and symmetrical.    Data Reviewed: I have personally reviewed following labs and imaging studies  CBC Lab Results  Component Value Date   WBC 7.8 01/16/2021   RBC 2.17 (L) 01/16/2021   HGB 6.9 (L) 01/16/2021   HCT 20.5 (L) 01/16/2021   MCV 94.5 01/16/2021   MCH 31.8 01/16/2021   PLT 238 01/16/2021   MCHC 33.7 01/16/2021   RDW 12.3 01/16/2021   LYMPHSABS 1.2 01/13/2021   MONOABS 1.0 01/13/2021   EOSABS 0.1 01/13/2021   BASOSABS 0.1 46/56/8127     Last metabolic panel Lab Results  Component Value Date   NA 127 (L) 01/16/2021   K 4.5 01/16/2021   CL 94 (L) 01/16/2021   CO2 20 (L) 01/16/2021   BUN 95 (H) 01/16/2021   CREATININE 10.64 (H) 01/16/2021   GLUCOSE 86 01/16/2021   GFRNONAA 3 (L) 01/16/2021   GFRAA 12 (L) 09/29/2019   CALCIUM 7.2 (L) 01/16/2021   PHOS 9.0 (H) 01/14/2021   PROT 6.3 (L) 01/14/2021   ALBUMIN 2.8 (L) 01/14/2021   BILITOT 1.1 01/14/2021   ALKPHOS 67 01/14/2021   AST 12 (L) 01/14/2021   ALT 9 01/14/2021   ANIONGAP 13 01/16/2021    Recent Results (from the past 240 hour(s))  Resp Panel by RT-PCR (Flu A&B, Covid) Nasopharyngeal Swab     Status: None   Collection Time: 01/13/21  6:24 AM   Specimen: Nasopharyngeal Swab; Nasopharyngeal(NP) swabs in vial transport medium  Result Value Ref Range Status   SARS Coronavirus 2 by RT PCR NEGATIVE NEGATIVE Final    Comment: (NOTE) SARS-CoV-2 target nucleic acids are NOT DETECTED.  The SARS-CoV-2 RNA is generally detectable in upper respiratory specimens during the acute phase of infection. The lowest concentration of SARS-CoV-2 viral copies this assay can detect is 138 copies/mL. A negative result does not preclude SARS-Cov-2 infection and should not be used as the sole basis for treatment  or other patient management decisions. A negative result may occur with  improper specimen collection/handling, submission of specimen other than nasopharyngeal swab, presence of viral mutation(s) within the areas targeted by this assay, and inadequate number of viral copies(<138 copies/mL). A negative result must be combined with clinical observations, patient history, and epidemiological information. The expected result is Negative.  Fact Sheet for Patients:  EntrepreneurPulse.com.au  Fact Sheet for Healthcare Providers:  IncredibleEmployment.be  This test is no t yet approved or cleared by the [place] FDA and  has been authorized for detection and/or diagnosis of SARS-CoV-2 by FDA under an Emergency Use Authorization (EUA). This EUA will remain  in effect (meaning this test can be used) for the duration of the COVID-19 declaration under Section 564(b)(1) of the Act, 21 U.S.C.section 360bbb-3(b)(1), unless the authorization is terminated  or revoked sooner.       Influenza A by PCR NEGATIVE NEGATIVE Final   Influenza B by PCR NEGATIVE NEGATIVE Final    Comment: (NOTE) The Xpert Xpress SARS-CoV-2/FLU/RSV plus assay is intended as an aid in the diagnosis of influenza from Nasopharyngeal swab specimens and should not  be used as a sole basis for treatment. Nasal washings and aspirates are unacceptable for Xpert Xpress SARS-CoV-2/FLU/RSV testing.  Fact Sheet for Patients: EntrepreneurPulse.com.au  Fact Sheet for Healthcare Providers: IncredibleEmployment.be  This test is not yet approved or cleared by the [place] FDA and has been authorized for detection and/or diagnosis of SARS-CoV-2 by FDA under an Emergency Use Authorization (EUA). This EUA will remain in effect (meaning this test can be used) for the duration of the COVID-19 declaration under Section 564(b)(1) of the Act, 21 U.S.C. section  360bbb-3(b)(1), unless the authorization is terminated or revoked.  Performed at [facility], [address] [place], [place] [address]         Radiology Studies: No results found.      Scheduled Meds:  lidocaine  2 patch Transdermal Q24H   Continuous Infusions:   LOS: 7 days   Lorella Nimrod, MD [facility] 01/20/2021, 5:31 PM  If 7PM-7AM, please contact night-coverage [email or web address]

## 2021-01-21 NOTE — Progress Notes (Signed)
[facility]) Hospital Liaison Note  Received request from Transitions of Care Manager Dayton Scrape, LCSW for family interest in [place]. Visited patient at bedside and spoke with daughter Butch Penny to confirm interest and explain services.  Approval for Hospice Home is determined by [facility] MD. Once [facility] MD has determined Hospice Home eligibility, [place] will update hospital staff and family.  Please do not hesitate to call with any hospice related questions.    Thank you for the opportunity to participate in this patient's care.   Bobbie "Loren Racer, RN, BSN [facility] Liaison [phone]

## 2021-01-21 NOTE — Care Management Important Message (Signed)
Important Message  Patient Details  Name: Kristi Brown MRN: [id number] Date of Birth: 1929/11/05   Medicare Important Message Given:  Other (see comment)  Patient is on comfort care with plans to discharge to the [place] when bed available. Out of respect for the patient and family no Important Message from [facility] given.   Kristi Brown 01/21/2021, 12:09 PM

## 2021-01-21 NOTE — TOC Progression Note (Signed)
Transition of Care [facility]) - Progression Note    Patient Details  Name: Kristi Brown MRN: [id number] Date of Birth: 1929-03-10  Transition of Care [facility]) CM/SW [place], LCSW Phone Number: 01/21/2021, 11:20 AM  Clinical Narrative:  Per palliative, family now on board with hospice home referral. Met with daughter [name] at bedside and she confirmed preference for [facility]. Made referral to Bobbie "Loren Racer, RN with [facility].   Expected Discharge Plan and Services                                                 Social Determinants of Health (SDOH) Interventions    Readmission Risk Interventions No flowsheet data found.

## 2021-01-21 NOTE — Progress Notes (Signed)
Daily Progress Note   Patient Name: Kristi Brown       Date: 01/21/2021 DOB: 1929-04-16  Age: 88 y.o. MRN#: [id number] Attending Physician: Lorella Nimrod, MD Primary Care Physician: Danae Orleans, MD Admit Date: 01/13/2021  Reason for Consultation/Follow-up: Establishing goals of care  Subjective: Patient is sitting in bed; family at bedside and offering her water. Patient does not speak. She appears comfortable. Daughter Butch Penny states they are currently working on discharge to the hospice facility, and she tells me they are amenable to this plan. We discussed the benefits of hospice facility.   No changes to comfort care regimen recommended at this time.    Length of Stay: 8  Current Medications: Scheduled Meds:   lidocaine  2 patch Transdermal Q24H    Continuous Infusions:   PRN Meds: albuterol, antiseptic oral rinse, diclofenac Sodium, glycopyrrolate **OR** glycopyrrolate **OR** glycopyrrolate, haloperidol **OR** haloperidol **OR** haloperidol lactate, HYDROmorphone (DILAUDID) injection, ondansetron **OR** ondansetron (ZOFRAN) IV, oxyCODONE **OR** oxyCODONE, polyvinyl alcohol  Physical Exam Pulmonary:     Effort: Pulmonary effort is normal.  Neurological:     Mental Status: She is alert.            Vital Signs: BP (!) 127/41 (BP Location: Right Arm)    Pulse 63    Temp 98 F (36.7 C) (Oral)    Resp 14    Ht 5\' 6"  (1.676 m)    Wt 70.2 kg    SpO2 (!) 78%    BMI 24.98 kg/m  SpO2: SpO2: (!) 78 % O2 Device: O2 Device: Room Air O2 Flow Rate: O2 Flow Rate (L/min): 2 L/min  Intake/output summary:  Intake/Output Summary (Last 24 hours) at 01/21/2021 1037 Last data filed at 01/20/2021 1300 Gross per 24 hour  Intake 0 ml  Output --  Net 0 ml   LBM: Last BM Date:  01/17/21 Baseline Weight: Weight: 70 kg Most recent weight: Weight: 70.2 kg     Patient Active Problem List   Diagnosis Date Noted   Comfort measures only status 01/16/2021   Type 2 diabetes mellitus with hypoglycemia without coma ([place]) 10/03/2020   Acute lower UTI 10/03/2020   Acute kidney injury superimposed on chronic kidney disease ([place]) 10/03/2020   Bilateral lower extremity edema 10/03/2020   AMS (altered mental status) 10/02/2020   Dementia (  [place]) 03/20/2020   TIA (transient ischemic attack) 09/19/2019   History of ITP 04/25/2019   Major neurocognitive disorder due to probable Alzheimer's disease, with behavioral disturbance ([place]) 10/12/2018   Pain due to onychomycosis of toenails of both feet 08/05/2018   Diabetes mellitus without complication ([place]) 82/80/0349   Asthma 07/16/2018   Carcinoid (except of appendix) 07/16/2018   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   Dyslipidemia 11/20/2017   Anemia of renal disease 07/03/2017   Iron deficiency anemia 07/15/2016   Vitamin D deficiency 07/15/2016   'light-for-dates' infant with signs of fetal malnutrition 07/14/2016   CKD (chronic kidney disease), stage V ([place]) 02/28/2016   GERD (gastroesophageal reflux disease) 02/28/2016   Hypothyroid 02/28/2016   Syncope 02/28/2016   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 No changes to comfort regimen recommended at this time.  Family tells me they are waiting for hospice facility placement.   Code Status:     Code Status Orders  (From admission, onward)           Start     Ordered   01/16/21 1800  Do not attempt resuscitation (DNR)  Continuous       Question Answer Comment  In the event of cardiac or respiratory ARREST Do not call a code blue   In the event of cardiac or respiratory ARREST Do not perform Intubation, CPR, defibrillation or ACLS   In the event of cardiac or respiratory ARREST Use medication by any route, position, wound care, and other measures to relive pain and suffering. May use oxygen, suction and manual treatment of airway obstruction as needed for comfort.      01/16/21 1801           Code Status History     Date Active Date Inactive Code Status Order ID Comments User Context   01/16/2021 1509 01/16/2021 1801 DNR [id number]  Mariel Aloe, MD Inpatient   01/13/2021 0814 01/16/2021 1508 Full Code [id number]  Leslee Home, DO ED   10/03/2020 0008 10/05/2020 2120 Full Code [id number]  Orene Desanctis, DO ED       Prognosis:  < 2 weeks   Care plan was discussed with [facility] and RN  Thank you for allowing the Palliative Medicine Team to assist in the care of this patient.       Total Time 15 min Prolonged Time Billed  no       Greater than 50%  of this time was spent counseling and coordinating care related to the above assessment and plan.  Asencion Gowda, NP  Please contact Palliative Medicine Team phone at [phone] for questions and concerns.

## 2021-01-21 NOTE — Progress Notes (Signed)
PROGRESS NOTE    [name]  VPX:[id number] DOB: 1929-04-23 DOA: 01/13/2021 PCP: Danae Orleans, MD   Brief Narrative: Kristi Brown is a 88 y.o. female with a history of GERD, hypertension, hypothyroidism, chronic kidney disease stage V, HFpEF, dementia, history of CVA/TIA. Patient presented secondary to shortness of breath and chest pain, found to have respiratory failure secondary to AKI and heart failure exacerbation. Lasix IV initiated without significant improvement in renal function. Patient with desire for no hemodialysis. Nephrology recommended hospice. Patient now comfort measures. Family is very adamant that patient has to stay in hospital-do not want transfer to hospice home. Palliative care to continue discussion as it is inappropriate to keep a stable patient for end-of-life care in acute care hospital.  01/21/21: Another discussion with family regarding moving to hospice home for better care and comfort, family now agrees.  No bed availability at hospice home today.  Subjective: Patient seems little agitated when seen today.  Multiple family members at bedside.  Received Dilaudid and Haldol.  Had another discussion regarding better comfort care at hospice home, family is now agreeable to move.  Assessment & Plan:   Principal Problem:   Acute kidney injury superimposed on chronic kidney disease ([place]) Active Problems:   CKD (chronic kidney disease), stage V (HCC)   Dementia (HCC)   HTN (hypertension)   Hyperlipidemia   Hypothyroid   Comfort measures only status   Acute respiratory failure with hypoxia Secondary to fluid overload from AKI on CKD and acute heart failure. Stable on 2 L/min of oxygen. Now comfort measures. -Oxycodone prn for dyspnea -Dilaudid IV prn for dyspnea if unable to take PO  AKI on CKD stage V Nephrology consulted. Patient desire for no dialysis. Patient managed with Lasix IV with hopes for improved kidney function, however this was  unsuccessful. Nephrology recommendation to transition to comfort measures. Now comfort measures.  Acute on chronic diastolic heart failure Patient managed with Lasix as managed above. Now comfort measures.  Hyponatremia Likely secondary to hypervolemia. Stable.   Dementia Discontinued Seroquel and Aricept. Comfort measures  Primary hypertension Discontinued amlodipine and Coreg. Comfort measures  GERD Discontinued Protonix. Comfort measures  Hypothyroidism Discontinued Synthroid. Comfort measures  History of CVA/TIA Discontinued aspirin, Plavix and Pravachol. Comfort measures  Generalized pain -Continue Tylenol (if able), Lidocaine and diclofenac gel -Oxycodone prn (if able) -Dilaudid 0.5 mg IV prn for severe pain  Goals of care Discussion with daughter on telephone and daughter at bedside, in addition son in law on 12/28. Decision made to transition to full comfort measures. Continuing chronic medications. Will continue oxygen at 2 L/min without escalation and treat dyspnea with PRN oxycodone and PRN dilaudid IV. Discussed discharge home with hospice, however daughter states patient wanted to stay in the hospital and have requested no discharge home. Discussed hospice facility and family declining at this time. Change code status to DNR.  DVT prophylaxis: Comfort measures Code Status:   Code Status: DNR Family Communication: Multiple family members at bedside. Disposition Plan: Anticipate in-hospital death   Consultants:  Nephrology Palliative care medicine  Procedures:  None  Antimicrobials:   Objective: Vitals:   01/19/21 0504 01/20/21 0516 01/21/21 0533 01/21/21 1306  BP: (!) 120/49 (!) 112/43 (!) 127/41 (!) 129/39  Pulse: 70 61 63 60  Resp: 18 12 14 14   Temp: 98.1 F (36.7 C) 97.7 F (36.5 C) 98 F (36.7 C) 97.7 F (36.5 C)  TempSrc: Oral Oral Oral   SpO2: (!) 72% [place] Kitchen)  84% (!) 78% (!) 77%  Weight:      Height:       No intake or output data in the 24  hours ending 01/21/21 1659  Filed Weights   01/14/21 0126 01/15/21 0500 01/16/21 0438  Weight: 68.1 kg 70.1 kg 70.2 kg    Examination:  General.  Frail elderly lady, in no acute distress. Pulmonary.  Lungs clear bilaterally, normal respiratory effort. CV.  Regular rate and rhythm, no JVD, rub or murmur. Abdomen.  Soft, nontender, nondistended, BS positive. CNS.  Awake, little agitated, not following any commands. Extremities.  No edema, no cyanosis, pulses intact and symmetrical. Psychiatry.  Judgment and insight appears impaired.   Data Reviewed: I have personally reviewed following labs and imaging studies  CBC Lab Results  Component Value Date   WBC 7.8 01/16/2021   RBC 2.17 (L) 01/16/2021   HGB 6.9 (L) 01/16/2021   HCT 20.5 (L) 01/16/2021   MCV 94.5 01/16/2021   MCH 31.8 01/16/2021   PLT 238 01/16/2021   MCHC 33.7 01/16/2021   RDW 12.3 01/16/2021   LYMPHSABS 1.2 01/13/2021   MONOABS 1.0 01/13/2021   EOSABS 0.1 01/13/2021   BASOSABS 0.1 40/81/4481     Last metabolic panel Lab Results  Component Value Date   NA 127 (L) 01/16/2021   K 4.5 01/16/2021   CL 94 (L) 01/16/2021   CO2 20 (L) 01/16/2021   BUN 95 (H) 01/16/2021   CREATININE 10.64 (H) 01/16/2021   GLUCOSE 86 01/16/2021   GFRNONAA 3 (L) 01/16/2021   GFRAA 12 (L) 09/29/2019   CALCIUM 7.2 (L) 01/16/2021   PHOS 9.0 (H) 01/14/2021   PROT 6.3 (L) 01/14/2021   ALBUMIN 2.8 (L) 01/14/2021   BILITOT 1.1 01/14/2021   ALKPHOS 67 01/14/2021   AST 12 (L) 01/14/2021   ALT 9 01/14/2021   ANIONGAP 13 01/16/2021    Recent Results (from the past 240 hour(s))  Resp Panel by RT-PCR (Flu A&B, Covid) Nasopharyngeal Swab     Status: None   Collection Time: 01/13/21  6:24 AM   Specimen: Nasopharyngeal Swab; Nasopharyngeal(NP) swabs in vial transport medium  Result Value Ref Range Status   SARS Coronavirus 2 by RT PCR NEGATIVE NEGATIVE Final    Comment: (NOTE) SARS-CoV-2 target nucleic acids are NOT DETECTED.  The  SARS-CoV-2 RNA is generally detectable in upper respiratory specimens during the acute phase of infection. The lowest concentration of SARS-CoV-2 viral copies this assay can detect is 138 copies/mL. A negative result does not preclude SARS-Cov-2 infection and should not be used as the sole basis for treatment or other patient management decisions. A negative result may occur with  improper specimen collection/handling, submission of specimen other than nasopharyngeal swab, presence of viral mutation(s) within the areas targeted by this assay, and inadequate number of viral copies(<138 copies/mL). A negative result must be combined with clinical observations, patient history, and epidemiological information. The expected result is Negative.  Fact Sheet for Patients:  EntrepreneurPulse.com.au  Fact Sheet for Healthcare Providers:  IncredibleEmployment.be  This test is no t yet approved or cleared by the [place] FDA and  has been authorized for detection and/or diagnosis of SARS-CoV-2 by FDA under an Emergency Use Authorization (EUA). This EUA will remain  in effect (meaning this test can be used) for the duration of the COVID-19 declaration under Section 564(b)(1) of the Act, 21 U.S.C.section 360bbb-3(b)(1), unless the authorization is terminated  or revoked sooner.       Influenza A  by PCR NEGATIVE NEGATIVE Final   Influenza B by PCR NEGATIVE NEGATIVE Final    Comment: (NOTE) The Xpert Xpress SARS-CoV-2/FLU/RSV plus assay is intended as an aid in the diagnosis of influenza from Nasopharyngeal swab specimens and should not be used as a sole basis for treatment. Nasal washings and aspirates are unacceptable for Xpert Xpress SARS-CoV-2/FLU/RSV testing.  Fact Sheet for Patients: EntrepreneurPulse.com.au  Fact Sheet for Healthcare Providers: IncredibleEmployment.be  This test is not yet approved or  cleared by the [place] FDA and has been authorized for detection and/or diagnosis of SARS-CoV-2 by FDA under an Emergency Use Authorization (EUA). This EUA will remain in effect (meaning this test can be used) for the duration of the COVID-19 declaration under Section 564(b)(1) of the Act, 21 U.S.C. section 360bbb-3(b)(1), unless the authorization is terminated or revoked.  Performed at [facility], [address], [place], [place] [address]         Radiology Studies: No results found.      Scheduled Meds:  lidocaine  2 patch Transdermal Q24H   Continuous Infusions:   LOS: 8 days   Lorella Nimrod, MD [facility] 01/21/2021, 4:59 PM  If 7PM-7AM, please contact night-coverage [email or web address]

## 2021-01-21 NOTE — Progress Notes (Signed)
[facility]) Hospital Liaison Note   Patient chart and information reviewed by [facility] physician. Hospice Home eligibility confirmed.    Unfortunately, Hospice Home is not able to offer a room today. Family and Dayton Scrape, LCSW [facility] Manager aware hospital liaison will follow up tomorrow or sooner if a room becomes available.    Please do not hesitate to call with any hospice related questions.    Thank you for the opportunity to participate in this patient's care.   Bobbie "Loren Racer, RN, BSN [facility] Liaison [phone]

## 2021-01-22 NOTE — TOC Progression Note (Signed)
Transition of Care [facility]) - Progression Note    Patient Details  Name: Kristi Brown MRN: [id number] Date of Birth: 1929/01/24  Transition of Care [facility]) CM/SW [place], RN Phone Number: 01/22/2021, 12:52 PM  Clinical Narrative:   Patient has a bed at hospice house, as per Sonia Baller with [facility].  Patient is expected to transport this evening .          Expected Discharge Plan and Services                                                 Social Determinants of Health (SDOH) Interventions    Readmission Risk Interventions No flowsheet data found.

## 2021-01-22 NOTE — Progress Notes (Signed)
Daily Progress Note   Patient Name: Kristi Brown       Date: 01/22/2021 DOB: Feb 28, 1929  Age: 88 y.o. MRN#: [id number] Attending Physician: Lorella Nimrod, MD Primary Care Physician: Danae Orleans, MD Admit Date: 01/13/2021  Reason for Consultation/Follow-up: Terminal Care  Subjective: Patient is resting in bed with eyes closed. No distress noted at this time. Family member present sitting in a bedside chair, turned toward window talking on phone.    No change to symptom management regimen recommended at this time.   Length of Stay: 9  Current Medications: Scheduled Meds:   lidocaine  2 patch Transdermal Q24H    Continuous Infusions:   PRN Meds: albuterol, antiseptic oral rinse, diclofenac Sodium, glycopyrrolate **OR** glycopyrrolate **OR** glycopyrrolate, haloperidol **OR** haloperidol **OR** haloperidol lactate, HYDROmorphone (DILAUDID) injection, ondansetron **OR** ondansetron (ZOFRAN) IV, oxyCODONE **OR** oxyCODONE, polyvinyl alcohol  Physical Exam Constitutional:      Comments: Eyes closed. No distress noted.             Vital Signs: BP (!) 129/40 (BP Location: Right Arm)    Pulse 60    Temp 97.6 F (36.4 C)    Resp 18    Ht 5\' 6"  (1.676 m)    Wt 70.2 kg    SpO2 94%    BMI 24.98 kg/m  SpO2: SpO2: 94 % O2 Device: O2 Device: Nasal Cannula O2 Flow Rate: O2 Flow Rate (L/min): 2 L/min  Intake/output summary: No intake or output data in the 24 hours ending 01/22/21 1341 LBM: Last BM Date: 01/17/21 Baseline Weight: Weight: 70 kg Most recent weight: Weight: 70.2 kg         Patient Active Problem List   Diagnosis Date Noted   Comfort measures only status 01/16/2021   Type 2 diabetes mellitus with hypoglycemia without coma ([place]) 10/03/2020   Acute lower UTI 10/03/2020    Acute kidney injury superimposed on chronic kidney disease ([place]) 10/03/2020   Bilateral lower extremity edema 10/03/2020   AMS (altered mental status) 10/02/2020   Dementia ([place]) 03/20/2020   TIA (transient ischemic attack) 09/19/2019   History of ITP 04/25/2019   Major neurocognitive disorder due to probable Alzheimer's disease, with behavioral disturbance ([place]) 10/12/2018   Pain due to onychomycosis of toenails of both feet 08/05/2018   Diabetes mellitus without complication ([place]) 65/79/0383  Asthma 07/16/2018   Carcinoid (except of appendix) 07/16/2018   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   Dyslipidemia 11/20/2017   Anemia of renal disease 07/03/2017   Iron deficiency anemia 07/15/2016   Vitamin D deficiency 07/15/2016   'light-for-dates' infant with signs of fetal malnutrition 07/14/2016   CKD (chronic kidney disease), stage V ([place]) 02/28/2016   GERD (gastroesophageal reflux disease) 02/28/2016   Hypothyroid 02/28/2016   Syncope 02/28/2016   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  Waiting for hospice facility bed.   Code Status:    Code Status Orders  (From admission, onward)           Start     Ordered   01/16/21 1800  Do not attempt resuscitation (DNR)  Continuous       Question Answer Comment  In the event of cardiac or respiratory ARREST Do not call a code blue   In the event of cardiac or respiratory ARREST Do not perform Intubation, CPR,  defibrillation or ACLS   In the event of cardiac or respiratory ARREST Use medication by any route, position, wound care, and other measures to relive pain and suffering. May use oxygen, suction and manual treatment of airway obstruction as needed for comfort.      01/16/21 1801           Code Status History     Date Active Date Inactive Code Status Order ID Comments User Context   01/16/2021 1509 01/16/2021 1801 DNR [id number]  Mariel Aloe, MD Inpatient   01/13/2021 0814 01/16/2021 1508 Full Code [id number]  Leslee Home, DO ED   10/03/2020 0008 10/05/2020 2120 Full Code [id number]  Orene Desanctis, DO ED       Prognosis:  < 2 weeks   Thank you for allowing the Palliative Medicine Team to assist in the care of this patient.       Total Time 15 min Prolonged Time Billed  no       Greater than 50%  of this time was spent counseling and coordinating care related to the above assessment and plan.  Asencion Gowda, NP  Please contact Palliative Medicine Team phone at [phone] for questions and concerns.

## 2021-01-22 NOTE — Discharge Summary (Signed)
Physician Discharge Summary  Kristi Brown MIW:[id number] DOB: [birth year] DOA: 01/13/2021  PCP: Danae Orleans, MD  Admit date: 01/13/2021 Discharge date: 01/22/2021  Admitted From: Home Disposition: Hospice home  Please follow up on the following pending results: None  Home Health: NA Equipment/Devices:NA Discharge Condition: Stable but guarded CODE STATUS: DNR Diet recommendation: Comfort feeds  Brief/Interim Summary: Kristi Brown is a 88 y.o. female with a history of GERD, hypertension, hypothyroidism, chronic kidney disease stage V, HFpEF, dementia, history of CVA/TIA. Patient presented secondary to shortness of breath and chest pain, found to have respiratory failure secondary to AKI and heart failure exacerbation. Lasix IV initiated without significant improvement in renal function. Patient with desire for no hemodialysis. Nephrology recommended hospice.  Patient was transitioned to comfort measures only.  Initially family wants to keep her in hospital till the end of life. Patient clinically stable and after having multiple discussions with the family they decided to move to hospice home for end-of-life support.  Patient is being discharged to hospice facility for further end-of-life management.  Discharge Diagnoses:  Principal Problem:   Acute kidney injury superimposed on chronic kidney disease ([place]) Active Problems:   CKD (chronic kidney disease), stage V (HCC)   Dementia (H[facility])   HTN (hypertension)   Hyperlipidemia   Hypothyroid   Comfort measures only status   Discharge Instructions  Discharge Instructions     Diet - low sodium heart healthy   Complete by: As directed    Increase activity slowly   Complete by: As directed       Allergies as of 01/22/2021       Reactions   Memantine Other (See Comments)   Excessive sedation Excessive sedation        Medication List     STOP taking these medications    Advair HFA 115-21 MCG/ACT inhaler Generic drug:  fluticasone-salmeterol   amLODipine 10 MG tablet Commonly known as: NORVASC   aspirin 81 MG EC tablet   Assure Comfort Lancets 28G Misc   B-D SINGLE USE SWABS REGULAR Pads   calcium carbonate 1250 (500 Ca) MG tablet Commonly known as: OS-CAL - dosed in mg of elemental calcium   carvedilol 25 MG tablet Commonly known as: COREG   clopidogrel 75 MG tablet Commonly known as: PLAVIX   donepezil 10 MG tablet Commonly known as: ARICEPT   fluticasone 50 MCG/ACT nasal spray Commonly known as: FLONASE   furosemide 40 MG tablet Commonly known as: LASIX   hydrochlorothiazide 12.5 MG capsule Commonly known as: MICROZIDE   levothyroxine 25 MCG tablet Commonly known as: SYNTHROID   loratadine 10 MG tablet Commonly known as: CLARITIN   losartan 25 MG tablet Commonly known as: COZAAR   nitroGLYCERIN 0.4 MG SL tablet Commonly known as: NITROSTAT   pravastatin 40 MG tablet Commonly known as: PRAVACHOL   Precision QID Test test strip Generic drug: glucose blood       TAKE these medications    albuterol 108 (90 Base) MCG/ACT inhaler Commonly known as: VENTOLIN HFA Inhale 1-2 puffs into the lungs every 6 (six) hours as needed for wheezing or shortness of breath.   esomeprazole 40 MG capsule Commonly known as: NEXIUM Take 40 mg by mouth daily at 12 noon.   QUEtiapine 25 MG tablet Commonly known as: SEROQUEL Take 12.5 mg by mouth 2 (two) times daily.        Allergies  Allergen Reactions   Memantine Other (See Comments)    Excessive sedation Excessive sedation  Consultations: Palliative care Nephrology  Procedures/Studies: DG Chest 1 View  Result Date: 01/13/2021 CLINICAL DATA:  Shortness of breath, cough EXAM: CHEST  1 VIEW COMPARISON:  10/04/2020 FINDINGS: Heart is mildly enlarged. Aortic atherosclerosis. Mild vascular congestion. No confluent opacities, effusions or edema. No acute bony abnormality. IMPRESSION: Mild cardiomegaly, vascular congestion.  Electronically Signed   By: Rolm Baptise M.D.   On: 01/13/2021 06:38    Subjective: Patient was seen and examined today.  She was resting comfortably.  No acute concerns.  Multiple family member at bedside.  Discharge Exam: Vitals:   01/21/21 2104 01/22/21 0642  BP: (!) 124/40 (!) 129/40  Pulse: 61 60  Resp: 18 18  Temp: 97.9 F (36.6 C) 97.6 F (36.4 C)  SpO2: 96% 94%   Vitals:   01/21/21 0533 01/21/21 1306 01/21/21 2104 01/22/21 0642  BP: (!) 127/41 (!) 129/39 (!) 124/40 (!) 129/40  Pulse: 63 60 61 60  Resp: 14 14 18 18   Temp: 98 F (36.7 C) 97.7 F (36.5 C) 97.9 F (36.6 C) 97.6 F (36.4 C)  TempSrc: Oral  Oral   SpO2: (!) 78% (!) 77% 96% 94%  Weight:      Height:        General: Pt is resting comfortably Cardiovascular: RRR, S1/S2 +, no rubs, no gallops Respiratory: CTA bilaterally, no wheezing, no rhonchi Abdominal: Soft, NT, ND, bowel sounds + Extremities: no edema, no cyanosis   The results of significant diagnostics from this hospitalization (including imaging, microbiology, ancillary and laboratory) are listed below for reference.    Microbiology: Recent Results (from the past 240 hour(s))  Resp Panel by RT-PCR (Flu A&B, Covid) Nasopharyngeal Swab     Status: None   Collection Time: 01/13/21  6:24 AM   Specimen: Nasopharyngeal Swab; Nasopharyngeal(NP) swabs in vial transport medium  Result Value Ref Range Status   SARS Coronavirus 2 by RT PCR NEGATIVE NEGATIVE Final    Comment: (NOTE) SARS-CoV-2 target nucleic acids are NOT DETECTED.  The SARS-CoV-2 RNA is generally detectable in upper respiratory specimens during the acute phase of infection. The lowest concentration of SARS-CoV-2 viral copies this assay can detect is 138 copies/mL. A negative result does not preclude SARS-Cov-2 infection and should not be used as the sole basis for treatment or other patient management decisions. A negative result may occur with  improper specimen  collection/handling, submission of specimen other than nasopharyngeal swab, presence of viral mutation(s) within the areas targeted by this assay, and inadequate number of viral copies(<138 copies/mL). A negative result must be combined with clinical observations, patient history, and epidemiological information. The expected result is Negative.  Fact Sheet for Patients:  EntrepreneurPulse.com.au  Fact Sheet for Healthcare Providers:  IncredibleEmployment.be  This test is no t yet approved or cleared by the [place] FDA and  has been authorized for detection and/or diagnosis of SARS-CoV-2 by FDA under an Emergency Use Authorization (EUA). This EUA will remain  in effect (meaning this test can be used) for the duration of the COVID-19 declaration under Section 564(b)(1) of the Act, 21 U.S.C.section 360bbb-3(b)(1), unless the authorization is terminated  or revoked sooner.       Influenza A by PCR NEGATIVE NEGATIVE Final   Influenza B by PCR NEGATIVE NEGATIVE Final    Comment: (NOTE) The Xpert Xpress SARS-CoV-2/FLU/RSV plus assay is intended as an aid in the diagnosis of influenza from Nasopharyngeal swab specimens and should not be used as a sole basis for treatment. Nasal washings and  aspirates are unacceptable for Xpert Xpress SARS-CoV-2/FLU/RSV testing.  Fact Sheet for Patients: EntrepreneurPulse.com.au  Fact Sheet for Healthcare Providers: IncredibleEmployment.be  This test is not yet approved or cleared by the [place] FDA and has been authorized for detection and/or diagnosis of SARS-CoV-2 by FDA under an Emergency Use Authorization (EUA). This EUA will remain in effect (meaning this test can be used) for the duration of the COVID-19 declaration under Section 564(b)(1) of the Act, 21 U.S.C. section 360bbb-3(b)(1), unless the authorization is terminated or revoked.  Performed at [facility], [address] [place], [place] [address]      Labs: BNP (last 3 results) Recent Labs    01/13/21 0624  BNP 854.6*   Basic Metabolic Panel: Recent Labs  Lab 01/16/21 0503  NA 127*  K 4.5  CL 94*  CO2 20*  GLUCOSE 86  BUN 95*  CREATININE 10.64*  CALCIUM 7.2*  MG 2.4   Liver Function Tests: No results for input(s): AST, ALT, ALKPHOS, BILITOT, PROT, ALBUMIN in the last 168 hours. No results for input(s): LIPASE, AMYLASE in the last 168 hours. No results for input(s): AMMONIA in the last 168 hours. CBC: Recent Labs  Lab 01/16/21 0503  WBC 7.8  HGB 6.9*  HCT 20.5*  MCV 94.5  PLT 238   Cardiac Enzymes: No results for input(s): CKTOTAL, CKMB, CKMBINDEX, TROPONINI in the last 168 hours. BNP: Invalid input(s): POCBNP CBG: No results for input(s): GLUCAP in the last 168 hours. D-Dimer No results for input(s): DDIMER in the last 72 hours. Hgb A1c No results for input(s): HGBA1C in the last 72 hours. Lipid Profile No results for input(s): CHOL, HDL, LDLCALC, TRIG, CHOLHDL, LDLDIRECT in the last 72 hours. Thyroid function studies No results for input(s): TSH, T4TOTAL, T3FREE, THYROIDAB in the last 72 hours.  Invalid input(s): FREET3 Anemia work up No results for input(s): VITAMINB12, FOLATE, FERRITIN, TIBC, IRON, RETICCTPCT in the last 72 hours. Urinalysis    Component Value Date/Time   COLORURINE YELLOW 01/13/2021 0806   APPEARANCEUR CLEAR (A) 01/13/2021 0806   LABSPEC 1.020 01/13/2021 0806   PHURINE 5.0 01/13/2021 0806   GLUCOSEU NEGATIVE 01/13/2021 0806   HGBUR NEGATIVE 01/13/2021 0806   BILIRUBINUR NEGATIVE 01/13/2021 0806   KETONESUR NEGATIVE 01/13/2021 0806   PROTEINUR >300 (A) 01/13/2021 0806   NITRITE 0.2 (A) 01/13/2021 0806   LEUKOCYTESUR TRACE (A) 01/13/2021 0806   Sepsis Labs Invalid input(s): PROCALCITONIN,  WBC,  LACTICIDVEN Microbiology Recent Results (from the past 240 hour(s))  Resp Panel by RT-PCR (Flu A&B, Covid)  Nasopharyngeal Swab     Status: None   Collection Time: 01/13/21  6:24 AM   Specimen: Nasopharyngeal Swab; Nasopharyngeal(NP) swabs in vial transport medium  Result Value Ref Range Status   SARS Coronavirus 2 by RT PCR NEGATIVE NEGATIVE Final    Comment: (NOTE) SARS-CoV-2 target nucleic acids are NOT DETECTED.  The SARS-CoV-2 RNA is generally detectable in upper respiratory specimens during the acute phase of infection. The lowest concentration of SARS-CoV-2 viral copies this assay can detect is 138 copies/mL. A negative result does not preclude SARS-Cov-2 infection and should not be used as the sole basis for treatment or other patient management decisions. A negative result may occur with  improper specimen collection/handling, submission of specimen other than nasopharyngeal swab, presence of viral mutation(s) within the areas targeted by this assay, and inadequate number of viral copies(<138 copies/mL). A negative result must be combined with clinical observations, patient history, and epidemiological information. The  expected result is Negative.  Fact Sheet for Patients:  EntrepreneurPulse.com.au  Fact Sheet for Healthcare Providers:  IncredibleEmployment.be  This test is no t yet approved or cleared by the [place] FDA and  has been authorized for detection and/or diagnosis of SARS-CoV-2 by FDA under an Emergency Use Authorization (EUA). This EUA will remain  in effect (meaning this test can be used) for the duration of the COVID-19 declaration under Section 564(b)(1) of the Act, 21 U.S.C.section 360bbb-3(b)(1), unless the authorization is terminated  or revoked sooner.       Influenza A by PCR NEGATIVE NEGATIVE Final   Influenza B by PCR NEGATIVE NEGATIVE Final    Comment: (NOTE) The Xpert Xpress SARS-CoV-2/FLU/RSV plus assay is intended as an aid in the diagnosis of influenza from Nasopharyngeal swab specimens and should not be  used as a sole basis for treatment. Nasal washings and aspirates are unacceptable for Xpert Xpress SARS-CoV-2/FLU/RSV testing.  Fact Sheet for Patients: EntrepreneurPulse.com.au  Fact Sheet for Healthcare Providers: IncredibleEmployment.be  This test is not yet approved or cleared by the [place] FDA and has been authorized for detection and/or diagnosis of SARS-CoV-2 by FDA under an Emergency Use Authorization (EUA). This EUA will remain in effect (meaning this test can be used) for the duration of the COVID-19 declaration under Section 564(b)(1) of the Act, 21 U.S.C. section 360bbb-3(b)(1), unless the authorization is terminated or revoked.  Performed at [facility], [address] [place], [place] [address]     Time coordinating discharge: Over 30 minutes  SIGNED:  Lorella Nimrod, MD  [facility] 01/22/2021, 12:56 PM  If 7PM-7AM, please contact night-coverage [email or web address]  This record has been created using Systems analyst. Errors have been sought and corrected,but may not always be located. Such creation errors do not reflect on the standard of care.

## 2021-01-22 NOTE — Progress Notes (Signed)
Patient discharged to [facility] PIV still in place. Report given over the phone. Patient transported via EMS.

## 2021-01-22 NOTE — Progress Notes (Signed)
[facility]) Hospital Liaison Note   Hospice Home is able to offer a bed today with request for transport at 5 pm.   Family agreeable to transfer today. Dreama Saa, RN [facility] Manager aware.   RN please call report to [place] at [phone] prior to patient leaving the unit.  Please send signed and completed DNR with patient at discharge.   Please do not hesitate to call with any hospice related questions.    Thank you for the opportunity to participate in this patient's care.   Bobbie "Loren Racer, RN, BSN [facility] Liaison [phone]

## 2021-02-20 DEATH — deceased
